# Patient Record
Sex: Male | Born: 1950
Health system: Southern US, Community
[De-identification: ages and names within clinical notes are randomized; demographics above are authoritative.]

## PROBLEM LIST (undated history)

## (undated) DIAGNOSIS — E785 Hyperlipidemia, unspecified: Secondary | ICD-10-CM

## (undated) DIAGNOSIS — G4733 Obstructive sleep apnea (adult) (pediatric): Secondary | ICD-10-CM

## (undated) DIAGNOSIS — S92909A Unspecified fracture of unspecified foot, initial encounter for closed fracture: Secondary | ICD-10-CM

## (undated) DIAGNOSIS — M549 Dorsalgia, unspecified: Secondary | ICD-10-CM

## (undated) DIAGNOSIS — J449 Chronic obstructive pulmonary disease, unspecified: Secondary | ICD-10-CM

## (undated) DIAGNOSIS — Z9989 Dependence on other enabling machines and devices: Secondary | ICD-10-CM

## (undated) DIAGNOSIS — E119 Type 2 diabetes mellitus without complications: Secondary | ICD-10-CM

## (undated) DIAGNOSIS — I10 Essential (primary) hypertension: Secondary | ICD-10-CM

## (undated) DIAGNOSIS — E669 Obesity, unspecified: Secondary | ICD-10-CM

## (undated) HISTORY — DX: Essential (primary) hypertension: I10

## (undated) HISTORY — DX: Obstructive sleep apnea (adult) (pediatric): G47.33

## (undated) HISTORY — DX: Unspecified fracture of unspecified foot, initial encounter for closed fracture: S92.909A

## (undated) HISTORY — DX: Type 2 diabetes mellitus without complications: E11.9

## (undated) HISTORY — DX: Hyperlipidemia, unspecified: E78.5

## (undated) HISTORY — PX: SHOULDER SURGERY: SHX246

## (undated) HISTORY — PX: OTHER SURGICAL HISTORY: SHX169

## (undated) HISTORY — DX: Dorsalgia, unspecified: M54.9

## (undated) HISTORY — DX: Dependence on other enabling machines and devices: Z99.89

---

## 1997-10-26 DIAGNOSIS — S92909A Unspecified fracture of unspecified foot, initial encounter for closed fracture: Secondary | ICD-10-CM

## 1997-10-26 HISTORY — DX: Unspecified fracture of unspecified foot, initial encounter for closed fracture: S92.909A

## 1998-06-23 ENCOUNTER — Encounter: Payer: Self-pay | Admitting: *Deleted

## 1998-06-23 ENCOUNTER — Observation Stay (HOSPITAL_COMMUNITY): Admission: EM | Admit: 1998-06-23 | Discharge: 1998-06-23 | Payer: Self-pay | Admitting: Emergency Medicine

## 1999-08-18 ENCOUNTER — Emergency Department (HOSPITAL_COMMUNITY): Admission: EM | Admit: 1999-08-18 | Discharge: 1999-08-18 | Payer: Self-pay | Admitting: Emergency Medicine

## 1999-08-18 ENCOUNTER — Encounter: Payer: Self-pay | Admitting: Emergency Medicine

## 2002-02-27 ENCOUNTER — Encounter: Admission: RE | Admit: 2002-02-27 | Discharge: 2002-02-27 | Payer: Self-pay | Admitting: Internal Medicine

## 2002-02-27 ENCOUNTER — Encounter: Payer: Self-pay | Admitting: Internal Medicine

## 2003-01-06 ENCOUNTER — Encounter: Payer: Self-pay | Admitting: Pulmonary Disease

## 2004-12-16 ENCOUNTER — Emergency Department (HOSPITAL_COMMUNITY): Admission: EM | Admit: 2004-12-16 | Discharge: 2004-12-17 | Payer: Self-pay | Admitting: Emergency Medicine

## 2005-05-14 ENCOUNTER — Ambulatory Visit: Payer: Self-pay

## 2005-05-15 ENCOUNTER — Ambulatory Visit: Payer: Self-pay | Admitting: Unknown Physician Specialty

## 2005-05-15 HISTORY — PX: COLONOSCOPY: SHX174

## 2005-11-16 ENCOUNTER — Ambulatory Visit: Payer: Self-pay | Admitting: Surgery

## 2005-12-31 ENCOUNTER — Encounter: Payer: Self-pay | Admitting: Family Medicine

## 2005-12-31 LAB — CONVERTED CEMR LAB: PSA: 0.81 ng/mL

## 2006-10-21 ENCOUNTER — Ambulatory Visit: Payer: Self-pay | Admitting: Family Medicine

## 2006-11-12 ENCOUNTER — Ambulatory Visit: Payer: Self-pay | Admitting: Internal Medicine

## 2007-02-07 ENCOUNTER — Ambulatory Visit: Payer: Self-pay | Admitting: Family Medicine

## 2007-02-21 ENCOUNTER — Telehealth (INDEPENDENT_AMBULATORY_CARE_PROVIDER_SITE_OTHER): Payer: Self-pay | Admitting: *Deleted

## 2007-05-04 ENCOUNTER — Encounter: Payer: Self-pay | Admitting: Family Medicine

## 2007-05-04 DIAGNOSIS — I10 Essential (primary) hypertension: Secondary | ICD-10-CM

## 2007-05-04 DIAGNOSIS — G4733 Obstructive sleep apnea (adult) (pediatric): Secondary | ICD-10-CM

## 2007-05-04 DIAGNOSIS — K649 Unspecified hemorrhoids: Secondary | ICD-10-CM | POA: Insufficient documentation

## 2007-05-04 DIAGNOSIS — J42 Unspecified chronic bronchitis: Secondary | ICD-10-CM

## 2007-09-27 ENCOUNTER — Ambulatory Visit: Payer: Self-pay | Admitting: Family Medicine

## 2007-09-29 ENCOUNTER — Telehealth: Payer: Self-pay | Admitting: Family Medicine

## 2007-10-02 DIAGNOSIS — M479 Spondylosis, unspecified: Secondary | ICD-10-CM | POA: Insufficient documentation

## 2007-10-09 ENCOUNTER — Emergency Department (HOSPITAL_COMMUNITY): Admission: EM | Admit: 2007-10-09 | Discharge: 2007-10-09 | Payer: Self-pay | Admitting: Emergency Medicine

## 2007-10-11 ENCOUNTER — Ambulatory Visit: Payer: Self-pay | Admitting: Urology

## 2007-10-11 ENCOUNTER — Encounter: Payer: Self-pay | Admitting: Family Medicine

## 2007-11-14 ENCOUNTER — Ambulatory Visit: Payer: Self-pay | Admitting: Urology

## 2007-11-17 ENCOUNTER — Ambulatory Visit: Payer: Self-pay | Admitting: Urology

## 2007-12-07 ENCOUNTER — Encounter: Payer: Self-pay | Admitting: Family Medicine

## 2007-12-07 ENCOUNTER — Ambulatory Visit: Payer: Self-pay | Admitting: Urology

## 2008-06-25 ENCOUNTER — Encounter: Payer: Self-pay | Admitting: Family Medicine

## 2008-06-28 ENCOUNTER — Ambulatory Visit: Payer: Self-pay | Admitting: Urology

## 2008-07-03 ENCOUNTER — Encounter: Payer: Self-pay | Admitting: Family Medicine

## 2008-10-06 ENCOUNTER — Encounter: Payer: Self-pay | Admitting: Family Medicine

## 2008-10-06 ENCOUNTER — Emergency Department (HOSPITAL_COMMUNITY): Admission: EM | Admit: 2008-10-06 | Discharge: 2008-10-06 | Payer: Self-pay | Admitting: Emergency Medicine

## 2008-10-09 ENCOUNTER — Encounter: Payer: Self-pay | Admitting: Pulmonary Disease

## 2008-10-10 ENCOUNTER — Encounter: Payer: Self-pay | Admitting: Family Medicine

## 2008-10-10 ENCOUNTER — Encounter: Payer: Self-pay | Admitting: Pulmonary Disease

## 2008-10-15 ENCOUNTER — Encounter: Payer: Self-pay | Admitting: Pulmonary Disease

## 2008-10-16 ENCOUNTER — Encounter: Payer: Self-pay | Admitting: Pulmonary Disease

## 2008-10-17 ENCOUNTER — Ambulatory Visit: Payer: Self-pay | Admitting: Family Medicine

## 2008-10-17 DIAGNOSIS — B370 Candidal stomatitis: Secondary | ICD-10-CM

## 2008-10-31 ENCOUNTER — Encounter: Payer: Self-pay | Admitting: Family Medicine

## 2008-11-05 ENCOUNTER — Telehealth: Payer: Self-pay | Admitting: Family Medicine

## 2008-11-06 ENCOUNTER — Ambulatory Visit: Payer: Self-pay | Admitting: Pulmonary Disease

## 2008-11-07 ENCOUNTER — Telehealth (INDEPENDENT_AMBULATORY_CARE_PROVIDER_SITE_OTHER): Payer: Self-pay | Admitting: *Deleted

## 2008-11-13 ENCOUNTER — Telehealth: Payer: Self-pay | Admitting: Pulmonary Disease

## 2008-11-14 ENCOUNTER — Telehealth: Payer: Self-pay | Admitting: Pulmonary Disease

## 2008-11-15 ENCOUNTER — Encounter: Payer: Self-pay | Admitting: Pulmonary Disease

## 2008-11-20 ENCOUNTER — Telehealth: Payer: Self-pay | Admitting: Pulmonary Disease

## 2008-11-22 ENCOUNTER — Ambulatory Visit: Payer: Self-pay | Admitting: Family Medicine

## 2008-11-22 ENCOUNTER — Encounter: Payer: Self-pay | Admitting: Pulmonary Disease

## 2008-11-22 DIAGNOSIS — M549 Dorsalgia, unspecified: Secondary | ICD-10-CM | POA: Insufficient documentation

## 2008-11-29 ENCOUNTER — Encounter: Payer: Self-pay | Admitting: Pulmonary Disease

## 2008-12-08 ENCOUNTER — Encounter: Payer: Self-pay | Admitting: Pulmonary Disease

## 2008-12-11 ENCOUNTER — Encounter: Payer: Self-pay | Admitting: Family Medicine

## 2008-12-11 ENCOUNTER — Encounter: Payer: Self-pay | Admitting: Pulmonary Disease

## 2008-12-18 ENCOUNTER — Encounter: Payer: Self-pay | Admitting: Pulmonary Disease

## 2008-12-18 ENCOUNTER — Telehealth (INDEPENDENT_AMBULATORY_CARE_PROVIDER_SITE_OTHER): Payer: Self-pay | Admitting: *Deleted

## 2009-06-24 ENCOUNTER — Encounter: Payer: Self-pay | Admitting: Family Medicine

## 2009-10-22 ENCOUNTER — Telehealth (INDEPENDENT_AMBULATORY_CARE_PROVIDER_SITE_OTHER): Payer: Self-pay | Admitting: *Deleted

## 2009-11-14 ENCOUNTER — Telehealth: Payer: Self-pay | Admitting: Family Medicine

## 2009-11-14 ENCOUNTER — Ambulatory Visit: Payer: Self-pay | Admitting: Family Medicine

## 2009-11-14 DIAGNOSIS — M1710 Unilateral primary osteoarthritis, unspecified knee: Secondary | ICD-10-CM | POA: Insufficient documentation

## 2009-11-14 DIAGNOSIS — IMO0002 Reserved for concepts with insufficient information to code with codable children: Secondary | ICD-10-CM | POA: Insufficient documentation

## 2010-05-13 ENCOUNTER — Telehealth: Payer: Self-pay | Admitting: Family Medicine

## 2010-05-19 ENCOUNTER — Ambulatory Visit: Payer: Self-pay | Admitting: Family Medicine

## 2010-05-19 DIAGNOSIS — M2390 Unspecified internal derangement of unspecified knee: Secondary | ICD-10-CM | POA: Insufficient documentation

## 2010-11-23 LAB — CONVERTED CEMR LAB
ALT: 42 units/L (ref 0–53)
AST: 29 units/L (ref 0–37)
Albumin: 4.2 g/dL (ref 3.5–5.2)
Alkaline Phosphatase: 57 units/L (ref 39–117)
BUN: 9 mg/dL (ref 6–23)
Bilirubin Urine: NEGATIVE
Bilirubin, Direct: 0.1 mg/dL (ref 0.0–0.3)
Blood in Urine, dipstick: NEGATIVE
CO2: 30 meq/L (ref 19–32)
Chloride: 100 meq/L (ref 96–112)
Creatinine, Ser: 0.8 mg/dL (ref 0.4–1.5)
GFR calc Af Amer: 128 mL/min
GFR calc non Af Amer: 106 mL/min
Glucose, Bld: 94 mg/dL (ref 70–99)
Glucose, Urine, Semiquant: NEGATIVE
Hemoglobin: 15.7 g/dL (ref 13.0–17.0)
Lymphocytes Relative: 14.1 % (ref 12.0–46.0)
Neutrophils Relative %: 74.8 % (ref 43.0–77.0)
Specific Gravity, Urine: <1.005
TSH: 1.67 microintl units/mL (ref 0.35–5.50)
Total Bilirubin: 0.7 mg/dL (ref 0.3–1.2)
Total Protein: 7.4 g/dL (ref 6.0–8.3)
Triglycerides: 230 mg/dL (ref 0–149)
VLDL: 46 mg/dL — ABNORMAL HIGH (ref 0–40)
WBC Urine, dipstick: NEGATIVE
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
## 2010-11-25 NOTE — Progress Notes (Signed)
Summary: wants injection  Phone Note Call from Patient Call back at [phone]   Caller: Patient Call For: Hannah Beat MD Summary of Call: Pt was given an injection in january for right knee pain.  This helped but the pain is coming back and he is asking if he can get another injection. Initial call taken by: Lowella Petties CMA,  May 13, 2010 12:22 PM  Follow-up for Phone Call        yes  xray appt 20 minute before appt, I want to see how much arthritis his knee has. Follow-up by: Hannah Beat MD,  May 13, 2010 1:04 PM
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
## 2010-11-25 NOTE — Progress Notes (Signed)
Summary: Ibuprofen 800mg  rx  Phone Note Call from Patient Call back at [phone] cell   Caller: Patient Call For: Phillip Beat MD Summary of Call: Pt saw Dr. Patsy Lager earlier today and pt thought Dr. Patsy Lager was going to give him a rx for Ibuprofen 800mg . After pt left he realized he did not have rx and request [facility] 800mg  [facility] [address] [phone]. Pt request a 90 day supply because is cheaper. Please advise.  Initial call taken by: Lewanda Rife LPN,  November 14, 2009 3:09 PM  Follow-up for Phone Call        Call in Ibuprofen 800 mg, 1 by mouth three times a day as needed pain, #270, 1 refill  [facility] as [facility] Follow-up by: Phillip Beat MD,  November 14, 2009 3:16 PM  Additional Follow-up for Phone Call Additional follow up Details #1::        rx called in Additional Follow-up by: Benny Lennert CMA (AAMA),  November 14, 2009 3:20 PM

## 2010-12-11 ENCOUNTER — Telehealth (INDEPENDENT_AMBULATORY_CARE_PROVIDER_SITE_OTHER): Payer: Self-pay | Admitting: *Deleted

## 2010-12-17 ENCOUNTER — Encounter (INDEPENDENT_AMBULATORY_CARE_PROVIDER_SITE_OTHER): Payer: Self-pay | Admitting: *Deleted

## 2010-12-17 ENCOUNTER — Other Ambulatory Visit: Payer: Self-pay | Admitting: Family Medicine

## 2010-12-17 ENCOUNTER — Other Ambulatory Visit (INDEPENDENT_AMBULATORY_CARE_PROVIDER_SITE_OTHER): Payer: BC Managed Care – PPO

## 2010-12-17 DIAGNOSIS — R5383 Other fatigue: Secondary | ICD-10-CM

## 2010-12-17 DIAGNOSIS — R5381 Other malaise: Secondary | ICD-10-CM

## 2010-12-17 DIAGNOSIS — Z131 Encounter for screening for diabetes mellitus: Secondary | ICD-10-CM

## 2010-12-17 DIAGNOSIS — I10 Essential (primary) hypertension: Secondary | ICD-10-CM

## 2010-12-17 DIAGNOSIS — Z125 Encounter for screening for malignant neoplasm of prostate: Secondary | ICD-10-CM

## 2010-12-17 DIAGNOSIS — Z13220 Encounter for screening for lipoid disorders: Secondary | ICD-10-CM

## 2010-12-17 DIAGNOSIS — E119 Type 2 diabetes mellitus without complications: Secondary | ICD-10-CM

## 2010-12-17 LAB — CBC WITH DIFFERENTIAL/PLATELET
Basophils Absolute: 0 10*3/uL (ref 0.0–0.1)
Eosinophils Absolute: 0.1 10*3/uL (ref 0.0–0.7)
Lymphocytes Relative: 22.1 % (ref 12.0–46.0)
Lymphs Abs: 1.6 10*3/uL (ref 0.7–4.0)
MCHC: 34.5 g/dL (ref 30.0–36.0)
Monocytes Absolute: 0.7 10*3/uL (ref 0.1–1.0)
Neutro Abs: 4.9 10*3/uL (ref 1.4–7.7)
Platelets: 282 10*3/uL (ref 150.0–400.0)
WBC: 7.4 10*3/uL (ref 4.5–10.5)

## 2010-12-17 LAB — BASIC METABOLIC PANEL
CO2: 30 mEq/L (ref 19–32)
Calcium: 9.8 mg/dL (ref 8.4–10.5)
GFR: 84.52 mL/min (ref >60.00)
Glucose, Bld: 127 mg/dL — ABNORMAL HIGH (ref 70–99)
Potassium: 4.2 mEq/L (ref 3.5–5.1)
Sodium: 141 mEq/L (ref 135–145)

## 2010-12-17 LAB — HEPATIC FUNCTION PANEL
ALT: 52 U/L (ref 0–53)
Total Protein: 7.1 g/dL (ref 6.0–8.3)

## 2010-12-17 LAB — LIPID PANEL: HDL: 39.5 mg/dL (ref >39.00)

## 2010-12-17 LAB — PSA: PSA: 0.78 ng/mL (ref 0.10–4.00)

## 2010-12-17 NOTE — Progress Notes (Signed)
----   Converted from flag ---- ---- 12/11/2010 12:15 PM, Hannah Beat MD wrote: Prephysical Labs, several days before, fasting BMP, HFP, FLP, CBC with diff, TSH, PSA: v77.91, v77.1, ,780.79, v76.44   ---- 12/11/2010 8:53 AM, Liane Comber CMA (AAMA) wrote: Lab orders please! Good Morning! This pt is scheduled for cpx labs Wed, which labs to draw and dx codes to use? Thanks Tasha ------------------------------

## 2010-12-24 ENCOUNTER — Encounter: Payer: Self-pay | Admitting: Family Medicine

## 2010-12-24 ENCOUNTER — Encounter (INDEPENDENT_AMBULATORY_CARE_PROVIDER_SITE_OTHER): Payer: BC Managed Care – PPO | Admitting: Family Medicine

## 2010-12-24 DIAGNOSIS — N521 Erectile dysfunction due to diseases classified elsewhere: Secondary | ICD-10-CM

## 2010-12-24 DIAGNOSIS — Z0000 Encounter for general adult medical examination without abnormal findings: Secondary | ICD-10-CM

## 2010-12-24 DIAGNOSIS — E785 Hyperlipidemia, unspecified: Secondary | ICD-10-CM

## 2010-12-24 DIAGNOSIS — E1159 Type 2 diabetes mellitus with other circulatory complications: Secondary | ICD-10-CM | POA: Insufficient documentation

## 2010-12-24 DIAGNOSIS — I10 Essential (primary) hypertension: Secondary | ICD-10-CM

## 2010-12-24 DIAGNOSIS — E119 Type 2 diabetes mellitus without complications: Secondary | ICD-10-CM
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
## 2011-03-09 ENCOUNTER — Telehealth: Payer: Self-pay | Admitting: *Deleted

## 2011-03-09 NOTE — Telephone Encounter (Signed)
Patient advised.

## 2011-03-09 NOTE — Telephone Encounter (Signed)
Patient wants to know if you will give him a written order to have his lab work done where he works? Patient states that he has an appointment scheduled for lab work next week and to see you the following week. Patient would like to cancel his appointment here for the labs and have it at work if that is okay with you. Call patient when lab order is ready for pickup.

## 2011-03-09 NOTE — Telephone Encounter (Signed)
Ordered - given to [employer].   Make sure he either: 1. Gets a copy of his labs and brings them in himself from his work. 2. We should push out date of appointment to later to ensure that they are here and seen at time of appointment.

## 2011-03-12 ENCOUNTER — Other Ambulatory Visit: Payer: Self-pay | Admitting: Family Medicine

## 2011-03-12 DIAGNOSIS — E785 Hyperlipidemia, unspecified: Secondary | ICD-10-CM

## 2011-03-13 NOTE — Assessment & Plan Note (Signed)
[facility]                                 ON-CALL NOTE   NAME:[name], [name]                        MRN:          [id number]  DATE:01/29/2007                            DOB:          1951/10/12    Caller:  Elyn Aquas.  Phone number:  [phone].   PRIMARY CARE PHYSICIAN:  Karie Schwalbe, M.D.   SUBJECTIVE:  The patient has hemorrhoids and was given Proctosol last  week.  He was instructed to use it four times a day for 14 days but has  run out of the quantity that was given.  He is requesting a refill  because his hemorrhoids are improved but not 100% better.   ASSESSMENT/PLAN:  Sent in refill of Proctosol HC 2.5% cream, apply to  affected area four times per day x14 days, zero refills.  Recommended if  symptoms do not improve to follow up with primary care doctor.     Kerby Nora, MD  Electronically Signed    AB/MedQ  DD: 01/29/2007  DT: 01/30/2007  Job #: [id number]

## 2011-03-16 ENCOUNTER — Other Ambulatory Visit: Payer: BC Managed Care – PPO

## 2011-03-21 ENCOUNTER — Encounter: Payer: Self-pay | Admitting: Family Medicine

## 2011-03-25 ENCOUNTER — Ambulatory Visit (INDEPENDENT_AMBULATORY_CARE_PROVIDER_SITE_OTHER): Payer: BC Managed Care – PPO | Admitting: Family Medicine

## 2011-03-25 ENCOUNTER — Encounter: Payer: Self-pay | Admitting: Family Medicine

## 2011-03-25 DIAGNOSIS — IMO0002 Reserved for concepts with insufficient information to code with codable children: Secondary | ICD-10-CM

## 2011-03-25 DIAGNOSIS — E119 Type 2 diabetes mellitus without complications: Secondary | ICD-10-CM

## 2011-03-25 DIAGNOSIS — M1710 Unilateral primary osteoarthritis, unspecified knee: Secondary | ICD-10-CM

## 2011-03-25 DIAGNOSIS — E785 Hyperlipidemia, unspecified: Secondary | ICD-10-CM

## 2011-03-25 DIAGNOSIS — I10 Essential (primary) hypertension: Secondary | ICD-10-CM

## 2011-03-25 MED ORDER — SILDENAFIL CITRATE 100 MG PO TABS: 100.0000 mg | ORAL_TABLET | ORAL | Status: DC | PRN
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
## 2011-07-31 LAB — DIFFERENTIAL
Eosinophils Absolute: 0.1 10*3/uL (ref 0.0–0.7)
Eosinophils Relative: 2 % (ref 0–5)
Monocytes Relative: 6 % (ref 3–12)
Neutro Abs: 5.3 10*3/uL (ref 1.7–7.7)

## 2011-07-31 LAB — CBC
HCT: 44.1 % (ref 39.0–52.0)
Hemoglobin: 15 g/dL (ref 13.0–17.0)
MCHC: 34.1 g/dL (ref 30.0–36.0)
Platelets: 241 10*3/uL (ref 150–400)
RBC: 4.77 MIL/uL (ref 4.22–5.81)

## 2011-07-31 LAB — POCT CARDIAC MARKERS
CKMB, poc: 1.4 ng/mL (ref 1.0–8.0)
Troponin i, poc: <0.05 ng/mL (ref 0.00–0.09)

## 2011-07-31 LAB — COMPREHENSIVE METABOLIC PANEL
ALT: 45 U/L (ref 0–53)
Albumin: 3.7 g/dL (ref 3.5–5.2)
CO2: 26 mEq/L (ref 19–32)
Calcium: 8.9 mg/dL (ref 8.4–10.5)
Chloride: 99 mEq/L (ref 96–112)
GFR calc Af Amer: >60 mL/min (ref >60)
GFR calc non Af Amer: >60 mL/min (ref >60)
Glucose, Bld: 180 mg/dL — ABNORMAL HIGH (ref 70–99)
Total Bilirubin: 1.5 mg/dL — ABNORMAL HIGH (ref 0.3–1.2)

## 2011-08-03 LAB — CBC
HCT: 43.7
Hemoglobin: 15.2
MCHC: 34.9
MCV: 90.1
Platelets: 304
RBC: 4.85
RDW: 13.4
WBC: 8.1

## 2011-08-03 LAB — COMPREHENSIVE METABOLIC PANEL
ALT: 41
AST: 31
Albumin: 3.9
Alkaline Phosphatase: 54
CO2: 25
Chloride: 105
Potassium: 3.9
Sodium: 138
Total Bilirubin: 1.1

## 2011-08-03 LAB — DIFFERENTIAL
Basophils Absolute: 0
Basophils Relative: 0
Eosinophils Absolute: 0.1 — ABNORMAL LOW
Eosinophils Relative: 1
Lymphocytes Relative: 18
Lymphs Abs: 1.5
Monocytes Absolute: 0.3
Monocytes Relative: 4
Neutro Abs: 6.2
Neutrophils Relative %: 76

## 2011-08-03 LAB — URINALYSIS, ROUTINE W REFLEX MICROSCOPIC
Bilirubin Urine: NEGATIVE
Glucose, UA: NEGATIVE
Ketones, ur: NEGATIVE
Leukocytes, UA: NEGATIVE
Nitrite: NEGATIVE
Protein, ur: 30 — AB
Specific Gravity, Urine: 1.027
Urobilinogen, UA: 0.2
pH: 6

## 2011-08-03 LAB — COMPREHENSIVE METABOLIC PANEL WITH GFR
BUN: 12
Calcium: 9.1
Creatinine, Ser: 0.85
GFR calc Af Amer: >60
GFR calc non Af Amer: >60
Glucose, Bld: 153 — ABNORMAL HIGH
Total Protein: 7.2

## 2011-08-03 LAB — URINE MICROSCOPIC-ADD ON

## 2011-08-03 LAB — LIPASE, BLOOD: Lipase: 22

## 2011-12-29 ENCOUNTER — Other Ambulatory Visit: Payer: Self-pay | Admitting: Family Medicine

## 2012-01-06 ENCOUNTER — Other Ambulatory Visit: Payer: Self-pay | Admitting: Family Medicine

## 2012-04-13 ENCOUNTER — Other Ambulatory Visit (INDEPENDENT_AMBULATORY_CARE_PROVIDER_SITE_OTHER): Payer: BC Managed Care – PPO

## 2012-04-13 DIAGNOSIS — Z125 Encounter for screening for malignant neoplasm of prostate: Secondary | ICD-10-CM

## 2012-04-13 DIAGNOSIS — Z79899 Other long term (current) drug therapy: Secondary | ICD-10-CM

## 2012-04-13 DIAGNOSIS — E785 Hyperlipidemia, unspecified: Secondary | ICD-10-CM

## 2012-04-13 DIAGNOSIS — E119 Type 2 diabetes mellitus without complications: Secondary | ICD-10-CM

## 2012-04-13 LAB — CBC WITH DIFFERENTIAL/PLATELET
Eosinophils Relative: 1.9 % (ref 0.0–5.0)
HCT: 44.5 % (ref 39.0–52.0)
Monocytes Relative: 8.6 % (ref 3.0–12.0)
Neutrophils Relative %: 68.3 % (ref 43.0–77.0)
Platelets: 238 10*3/uL (ref 150.0–400.0)
WBC: 6.8 10*3/uL (ref 4.5–10.5)

## 2012-04-13 LAB — BASIC METABOLIC PANEL
CO2: 27 mEq/L (ref 19–32)
Glucose, Bld: 100 mg/dL — ABNORMAL HIGH (ref 70–99)
Potassium: 4.6 mEq/L (ref 3.5–5.1)
Sodium: 140 mEq/L (ref 135–145)

## 2012-04-13 LAB — HEPATIC FUNCTION PANEL
ALT: 38 U/L (ref 0–53)
AST: 26 U/L (ref 0–37)
Albumin: 3.9 g/dL (ref 3.5–5.2)

## 2012-04-13 LAB — LIPID PANEL
HDL: 45.7 mg/dL (ref >39.00)
Total CHOL/HDL Ratio: 3

## 2012-04-13 LAB — MICROALBUMIN / CREATININE URINE RATIO
Microalb Creat Ratio: 0.5 mg/g (ref 0.0–30.0)
Microalb, Ur: 0.9 mg/dL (ref 0.0–1.9)

## 2012-04-13 LAB — HEMOGLOBIN A1C: Hgb A1c MFr Bld: 6.1 % (ref 4.6–6.5)

## 2012-04-20 ENCOUNTER — Encounter: Payer: Self-pay | Admitting: Family Medicine

## 2012-04-20 ENCOUNTER — Ambulatory Visit (INDEPENDENT_AMBULATORY_CARE_PROVIDER_SITE_OTHER): Payer: BC Managed Care – PPO | Admitting: Family Medicine

## 2012-04-20 VITALS — BP 152/78 | HR 72 | Temp 98.4°F | Ht 68.0 in | Wt 264.0 lb

## 2012-04-20 DIAGNOSIS — Z2911 Encounter for prophylactic immunotherapy for respiratory syncytial virus (RSV): Secondary | ICD-10-CM

## 2012-04-20 DIAGNOSIS — Z23 Encounter for immunization: Secondary | ICD-10-CM

## 2012-04-20 DIAGNOSIS — Z0000 Encounter for general adult medical examination without abnormal findings: Secondary | ICD-10-CM

## 2012-04-20 MED ORDER — ATORVASTATIN CALCIUM 20 MG PO TABS: 20.0000 mg | ORAL_TABLET | Freq: Every day | ORAL | Status: DC
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
## 2012-05-10 ENCOUNTER — Telehealth: Payer: Self-pay | Admitting: *Deleted

## 2012-05-10 NOTE — Telephone Encounter (Signed)
Patient's cpap water chamber started leaking last night and he would like verbal ok for new water chamber and mask called to [employer] [facility].  They will fax CME for signature.  OK to call in?

## 2012-05-10 NOTE — Telephone Encounter (Signed)
Called [facility] to give verbal ok for supplies.  He said pt has already paid out of pocket for supplies, but he would like CME signed for next time.

## 2012-05-10 NOTE — Telephone Encounter (Signed)
Okay to call in. 

## 2012-09-03 ENCOUNTER — Other Ambulatory Visit: Payer: Self-pay | Admitting: Family Medicine

## 2012-10-13 ENCOUNTER — Other Ambulatory Visit: Payer: Self-pay | Admitting: Family Medicine

## 2012-10-31 ENCOUNTER — Ambulatory Visit (INDEPENDENT_AMBULATORY_CARE_PROVIDER_SITE_OTHER): Payer: BC Managed Care – PPO | Admitting: Family Medicine

## 2012-10-31 ENCOUNTER — Ambulatory Visit (INDEPENDENT_AMBULATORY_CARE_PROVIDER_SITE_OTHER)
Admission: RE | Admit: 2012-10-31 | Discharge: 2012-10-31 | Disposition: A | Discharge status: Home / Self Care | Payer: BC Managed Care – PPO | Source: Ambulatory Visit | Attending: Family Medicine | Admitting: Family Medicine

## 2012-10-31 ENCOUNTER — Encounter: Payer: Self-pay | Admitting: Family Medicine

## 2012-10-31 VITALS — BP 130/80 | HR 73 | Temp 98.5°F | Ht 68.0 in | Wt 278.8 lb

## 2012-10-31 DIAGNOSIS — R0602 Shortness of breath: Secondary | ICD-10-CM

## 2012-10-31 DIAGNOSIS — E119 Type 2 diabetes mellitus without complications: Secondary | ICD-10-CM

## 2012-10-31 DIAGNOSIS — R079 Chest pain, unspecified: Secondary | ICD-10-CM

## 2012-10-31 MED ORDER — LISINOPRIL-HYDROCHLOROTHIAZIDE 20-12.5 MG PO TABS: 2.0000 | ORAL_TABLET | Freq: Every day | ORAL | Status: DC

## 2012-10-31 MED ORDER — METFORMIN HCL ER 500 MG PO TB24: 500.0000 mg | ORAL_TABLET | Freq: Every day | ORAL | Status: DC

## 2012-10-31 MED ORDER — ATORVASTATIN CALCIUM 20 MG PO TABS: 20.0000 mg | ORAL_TABLET | Freq: Every day | ORAL | Status: DC

## 2012-10-31 NOTE — Patient Instructions (Signed)
REFERRAL: GO THE THE FRONT ROOM AT THE ENTRANCE OF OUR CLINIC, NEAR CHECK IN. ASK FOR MARION. SHE WILL HELP YOU SET UP YOUR REFERRAL. DATE: TIME:  
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
## 2012-11-01 LAB — BASIC METABOLIC PANEL
CO2: 26 mEq/L (ref 19–32)
Chloride: 101 mEq/L (ref 96–112)
Sodium: 136 mEq/L (ref 135–145)

## 2012-11-01 LAB — HEMOGLOBIN A1C: Hgb A1c MFr Bld: 6.6 % — ABNORMAL HIGH (ref 4.6–6.5)

## 2012-11-02 ENCOUNTER — Encounter: Payer: Self-pay | Admitting: *Deleted

## 2013-04-24 ENCOUNTER — Other Ambulatory Visit (INDEPENDENT_AMBULATORY_CARE_PROVIDER_SITE_OTHER): Payer: BC Managed Care – PPO

## 2013-04-24 DIAGNOSIS — I10 Essential (primary) hypertension: Secondary | ICD-10-CM

## 2013-04-24 DIAGNOSIS — Z125 Encounter for screening for malignant neoplasm of prostate: Secondary | ICD-10-CM

## 2013-04-24 DIAGNOSIS — R5381 Other malaise: Secondary | ICD-10-CM

## 2013-04-24 DIAGNOSIS — E785 Hyperlipidemia, unspecified: Secondary | ICD-10-CM

## 2013-04-24 DIAGNOSIS — Z79899 Other long term (current) drug therapy: Secondary | ICD-10-CM

## 2013-04-24 DIAGNOSIS — E119 Type 2 diabetes mellitus without complications: Secondary | ICD-10-CM

## 2013-04-24 LAB — BASIC METABOLIC PANEL
CO2: 25 mEq/L (ref 19–32)
Chloride: 108 mEq/L (ref 96–112)
Glucose, Bld: 148 mg/dL — ABNORMAL HIGH (ref 70–99)
Potassium: 4.7 mEq/L (ref 3.5–5.1)
Sodium: 141 mEq/L (ref 135–145)

## 2013-04-24 LAB — CBC WITH DIFFERENTIAL/PLATELET
Basophils Absolute: 0 10*3/uL (ref 0.0–0.1)
HCT: 42.8 % (ref 39.0–52.0)
Lymphs Abs: 1.6 10*3/uL (ref 0.7–4.0)
MCV: 94 fl (ref 78.0–100.0)
Monocytes Absolute: 0.5 10*3/uL (ref 0.1–1.0)
Monocytes Relative: 8.9 % (ref 3.0–12.0)
Platelets: 243 10*3/uL (ref 150.0–400.0)
RDW: 14.1 % (ref 11.5–14.6)

## 2013-04-24 LAB — MICROALBUMIN / CREATININE URINE RATIO: Microalb Creat Ratio: 0.5 mg/g (ref 0.0–30.0)

## 2013-04-24 LAB — LIPID PANEL
HDL: 41.4 mg/dL (ref >39.00)
Total CHOL/HDL Ratio: 3

## 2013-04-24 LAB — HEMOGLOBIN A1C: Hgb A1c MFr Bld: 6.8 % — ABNORMAL HIGH (ref 4.6–6.5)

## 2013-04-24 LAB — HEPATIC FUNCTION PANEL: Total Bilirubin: 0.5 mg/dL (ref 0.3–1.2)

## 2013-05-01 ENCOUNTER — Ambulatory Visit (INDEPENDENT_AMBULATORY_CARE_PROVIDER_SITE_OTHER): Payer: BC Managed Care – PPO | Admitting: Family Medicine

## 2013-05-01 ENCOUNTER — Encounter: Payer: Self-pay | Admitting: Family Medicine

## 2013-05-01 VITALS — BP 140/74 | HR 94 | Temp 98.6°F | Ht 68.0 in | Wt 276.5 lb

## 2013-05-01 DIAGNOSIS — Z0000 Encounter for general adult medical examination without abnormal findings: Secondary | ICD-10-CM

## 2013-05-01 MED ORDER — VARDENAFIL HCL 10 MG PO TABS: 10.0000 mg | ORAL_TABLET | Freq: Every day | ORAL | Status: DC | PRN
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
## 2013-05-04 ENCOUNTER — Other Ambulatory Visit: Payer: Self-pay

## 2013-10-24 ENCOUNTER — Telehealth: Payer: Self-pay

## 2013-10-24 DIAGNOSIS — G4730 Sleep apnea, unspecified: Secondary | ICD-10-CM

## 2013-10-24 NOTE — Telephone Encounter (Signed)
Lupita Leash, do you mind helping me do this? Easiest way is probably to write on paper script - Dr. B will likely sign without problem.  CPAP mask Dx: Obstructive Sleep Apnea (ICD-9 code should be in the chart) LON: 99 Years Disp: #1

## 2013-10-24 NOTE — Telephone Encounter (Signed)
Pt left v/m requesting order for new cpap mask faxed to [facility]; fax # [phone].

## 2013-10-24 NOTE — Telephone Encounter (Signed)
DME order for CPAP mask entered in [facility] and faxed to [facility] [phone].

## 2013-10-27 NOTE — Telephone Encounter (Signed)
Noted  

## 2013-10-27 NOTE — Telephone Encounter (Signed)
[name] with [facility] received referral for cpap face mask; [name] is faxing over certificate of medical necessity and only needs Dr Rometta Emery signature on form and also request pts last office visit note faxed back with certificate of medical necessity to fax # [phone].

## 2013-10-27 NOTE — Telephone Encounter (Signed)
Will complete when in inbox, route to CMA to send recs

## 2013-10-31 NOTE — Telephone Encounter (Signed)
Certificate of Medical Necessity and last office note faxed to [facility] [phone].

## 2013-11-21 ENCOUNTER — Encounter: Payer: Self-pay | Admitting: Internal Medicine

## 2013-11-21 ENCOUNTER — Ambulatory Visit (INDEPENDENT_AMBULATORY_CARE_PROVIDER_SITE_OTHER): Payer: BC Managed Care – PPO | Admitting: Internal Medicine

## 2013-11-21 VITALS — BP 148/82 | HR 86 | Temp 98.6°F | Wt 281.1 lb

## 2013-11-21 DIAGNOSIS — M25571 Pain in right ankle and joints of right foot: Secondary | ICD-10-CM

## 2013-11-21 DIAGNOSIS — J069 Acute upper respiratory infection, unspecified: Secondary | ICD-10-CM

## 2013-11-21 DIAGNOSIS — M25579 Pain in unspecified ankle and joints of unspecified foot: Secondary | ICD-10-CM

## 2013-11-21 DIAGNOSIS — M25572 Pain in left ankle and joints of left foot: Secondary | ICD-10-CM

## 2013-11-21 MED ORDER — AZITHROMYCIN 250 MG PO TABS: ORAL_TABLET | ORAL | Status: DC

## 2013-11-21 NOTE — Patient Instructions (Signed)
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
## 2013-11-21 NOTE — Progress Notes (Signed)
Pre-visit discussion using our clinic review tool. No additional management support is needed unless otherwise documented below in the visit note.  

## 2013-11-24 ENCOUNTER — Telehealth: Payer: Self-pay | Admitting: Family Medicine

## 2013-11-24 NOTE — Telephone Encounter (Signed)
Reviewed OV from 1/27.   Okay to write a note for him to remain out until Monday 2/1.  He was thought to have a viral illness, but was given a z-pack given he insisted. He may have viral infection.... Need to given symptoms more time to resolved. Usually 7-0 days of illness.

## 2013-11-24 NOTE — Telephone Encounter (Signed)
Pt is requesting note to miss work this week, he says job is threatening to fire him.  OV with Dr. Lorelei Pont on 11/21/13.  Sent to Dr. Diona Browner who is covering for Dr. Lorelei Pont while he is out of town.  He is also requesting refill on abx.

## 2013-11-24 NOTE — Telephone Encounter (Signed)
[name] notified as instructed by telephone.  Note for work written and placed up front for patient to pick up.

## 2013-11-29 ENCOUNTER — Telehealth: Payer: Self-pay | Admitting: Family Medicine

## 2013-11-29 NOTE — Telephone Encounter (Signed)
Pt dropped off FMLA forms to be completed. I have completed these forms w/the exception of one section, which needs Dr. Rometta Emery review (I have flagged this section).  I have placed the forms in a yellow folder and put on your desk to pass on to Dr. Diona Browner.  Once she has review/completed and signed, please return to me. Thank you.

## 2013-11-29 NOTE — Telephone Encounter (Signed)
Noted  

## 2013-11-30 DIAGNOSIS — Z0279 Encounter for issue of other medical certificate: Secondary | ICD-10-CM

## 2013-11-30 NOTE — Telephone Encounter (Signed)
Left mssg letting pt know he can p/u forms at front.

## 2013-12-10 ENCOUNTER — Other Ambulatory Visit: Payer: Self-pay | Admitting: Family Medicine

## 2014-02-14 ENCOUNTER — Other Ambulatory Visit: Payer: Self-pay | Admitting: Family Medicine

## 2014-03-13 ENCOUNTER — Telehealth: Payer: Self-pay | Admitting: Family Medicine

## 2014-03-13 NOTE — Telephone Encounter (Signed)
Patient's last physical was [id number].  Patient is considering retiring and he'll lose his insurance when he retires.  Patient wants to know if he can have a physical before 04/04/14.  He checked with his insurance and they said he can have one physical every calendar year.  Your next available for a physical is on 04/16/14.  Can patient be fit in before 04/04/14 for a physical?

## 2014-03-14 NOTE — Telephone Encounter (Signed)
30 min  thanks

## 2014-03-14 NOTE — Telephone Encounter (Signed)
To carrie:

## 2014-03-27 ENCOUNTER — Other Ambulatory Visit: Payer: Self-pay | Admitting: Family Medicine

## 2014-03-27 DIAGNOSIS — E785 Hyperlipidemia, unspecified: Secondary | ICD-10-CM

## 2014-03-27 DIAGNOSIS — Z125 Encounter for screening for malignant neoplasm of prostate: Secondary | ICD-10-CM

## 2014-03-27 DIAGNOSIS — Z79899 Other long term (current) drug therapy: Secondary | ICD-10-CM

## 2014-03-27 DIAGNOSIS — E119 Type 2 diabetes mellitus without complications: Secondary | ICD-10-CM

## 2014-03-28 ENCOUNTER — Other Ambulatory Visit (INDEPENDENT_AMBULATORY_CARE_PROVIDER_SITE_OTHER): Payer: BC Managed Care – PPO

## 2014-03-28 DIAGNOSIS — Z79899 Other long term (current) drug therapy: Secondary | ICD-10-CM

## 2014-03-28 DIAGNOSIS — E119 Type 2 diabetes mellitus without complications: Secondary | ICD-10-CM

## 2014-03-28 DIAGNOSIS — Z125 Encounter for screening for malignant neoplasm of prostate: Secondary | ICD-10-CM

## 2014-03-28 DIAGNOSIS — E785 Hyperlipidemia, unspecified: Secondary | ICD-10-CM

## 2014-03-28 DIAGNOSIS — R5383 Other fatigue: Secondary | ICD-10-CM

## 2014-03-28 DIAGNOSIS — I10 Essential (primary) hypertension: Secondary | ICD-10-CM

## 2014-03-28 DIAGNOSIS — R5381 Other malaise: Secondary | ICD-10-CM

## 2014-03-28 LAB — CBC WITH DIFFERENTIAL/PLATELET
BASOS PCT: 0.5 % (ref 0.0–3.0)
Basophils Absolute: 0 10*3/uL (ref 0.0–0.1)
EOS PCT: 2 % (ref 0.0–5.0)
Eosinophils Absolute: 0.1 10*3/uL (ref 0.0–0.7)
HCT: 46 % (ref 39.0–52.0)
Hemoglobin: 15.3 g/dL (ref 13.0–17.0)
LYMPHS PCT: 23.9 % (ref 12.0–46.0)
Lymphs Abs: 1.8 10*3/uL (ref 0.7–4.0)
MCHC: 33.2 g/dL (ref 30.0–36.0)
MCV: 93.5 fl (ref 78.0–100.0)
MONOS PCT: 9.8 % (ref 3.0–12.0)
Monocytes Absolute: 0.7 10*3/uL (ref 0.1–1.0)
NEUTROS PCT: 63.8 % (ref 43.0–77.0)
Neutro Abs: 4.8 10*3/uL (ref 1.4–7.7)
Platelets: 265 10*3/uL (ref 150.0–400.0)
RBC: 4.92 Mil/uL (ref 4.22–5.81)
RDW: 14.6 % (ref 11.5–15.5)
WBC: 7.5 10*3/uL (ref 4.0–10.5)

## 2014-03-28 LAB — HEPATIC FUNCTION PANEL
ALT: 54 U/L — AB (ref 0–53)
AST: 36 U/L (ref 0–37)
Albumin: 3.9 g/dL (ref 3.5–5.2)
Alkaline Phosphatase: 48 U/L (ref 39–117)
BILIRUBIN DIRECT: 0.2 mg/dL (ref 0.0–0.3)
TOTAL PROTEIN: 7.1 g/dL (ref 6.0–8.3)
Total Bilirubin: 1 mg/dL (ref 0.2–1.2)

## 2014-03-28 LAB — BASIC METABOLIC PANEL
BUN: 18 mg/dL (ref 6–23)
CO2: 28 mEq/L (ref 19–32)
CREATININE: 1 mg/dL (ref 0.4–1.5)
Calcium: 9.3 mg/dL (ref 8.4–10.5)
Chloride: 102 mEq/L (ref 96–112)
GFR: 82.63 mL/min (ref >60.00)
Glucose, Bld: 130 mg/dL — ABNORMAL HIGH (ref 70–99)
Potassium: 4.2 mEq/L (ref 3.5–5.1)
Sodium: 137 mEq/L (ref 135–145)

## 2014-03-28 LAB — MICROALBUMIN / CREATININE URINE RATIO
CREATININE, U: 293.9 mg/dL
MICROALB UR: 3.4 mg/dL — AB (ref 0.0–1.9)
Microalb Creat Ratio: 1.2 mg/g (ref 0.0–30.0)

## 2014-03-28 LAB — LIPID PANEL
CHOLESTEROL: 174 mg/dL (ref 0–200)
HDL: 41 mg/dL (ref >39.00)
LDL Cholesterol: 101 mg/dL — ABNORMAL HIGH (ref 0–99)
NonHDL: 133
TRIGLYCERIDES: 158 mg/dL — AB (ref 0.0–149.0)
Total CHOL/HDL Ratio: 4
VLDL: 31.6 mg/dL (ref 0.0–40.0)

## 2014-03-28 LAB — PSA: PSA: 1.19 ng/mL (ref 0.10–4.00)

## 2014-03-28 LAB — HEMOGLOBIN A1C: Hgb A1c MFr Bld: 6.8 % — ABNORMAL HIGH (ref 4.6–6.5)

## 2014-04-02 ENCOUNTER — Encounter: Payer: Self-pay | Admitting: Family Medicine

## 2014-04-02 ENCOUNTER — Ambulatory Visit (INDEPENDENT_AMBULATORY_CARE_PROVIDER_SITE_OTHER): Payer: BC Managed Care – PPO | Admitting: Family Medicine

## 2014-04-02 VITALS — BP 132/82 | HR 60 | Temp 98.4°F | Ht 68.0 in | Wt 285.5 lb

## 2014-04-02 DIAGNOSIS — I798 Other disorders of arteries, arterioles and capillaries in diseases classified elsewhere: Secondary | ICD-10-CM

## 2014-04-02 DIAGNOSIS — N521 Erectile dysfunction due to diseases classified elsewhere: Secondary | ICD-10-CM

## 2014-04-02 DIAGNOSIS — E1169 Type 2 diabetes mellitus with other specified complication: Secondary | ICD-10-CM | POA: Insufficient documentation

## 2014-04-02 DIAGNOSIS — N529 Male erectile dysfunction, unspecified: Secondary | ICD-10-CM

## 2014-04-02 DIAGNOSIS — E1159 Type 2 diabetes mellitus with other circulatory complications: Secondary | ICD-10-CM

## 2014-04-02 DIAGNOSIS — E785 Hyperlipidemia, unspecified: Secondary | ICD-10-CM

## 2014-04-02 DIAGNOSIS — Z23 Encounter for immunization: Secondary | ICD-10-CM

## 2014-04-02 DIAGNOSIS — I10 Essential (primary) hypertension: Secondary | ICD-10-CM

## 2014-04-02 DIAGNOSIS — Z0000 Encounter for general adult medical examination without abnormal findings: Secondary | ICD-10-CM

## 2014-04-02 MED ORDER — ATORVASTATIN CALCIUM 20 MG PO TABS: 20.0000 mg | ORAL_TABLET | Freq: Every day | ORAL | Status: DC

## 2014-04-02 MED ORDER — LISINOPRIL-HYDROCHLOROTHIAZIDE 20-12.5 MG PO TABS: ORAL_TABLET | ORAL | Status: DC

## 2014-04-02 MED ORDER — METFORMIN HCL ER 500 MG PO TB24: ORAL_TABLET | ORAL | Status: DC

## 2014-04-02 NOTE — Progress Notes (Signed)
Pre visit review using our clinic review tool, if applicable. No additional management support is needed unless otherwise documented below in the visit note. 
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
## 2014-04-02 NOTE — Patient Instructions (Signed)
Call Dr. Percell [place] office - when is my colonoscopy due?

## 2014-04-03 ENCOUNTER — Telehealth: Payer: Self-pay | Admitting: Family Medicine

## 2014-04-03 NOTE — Telephone Encounter (Signed)
Relevant patient education assigned to patient using Emmi. ° °

## 2014-04-25 ENCOUNTER — Other Ambulatory Visit: Payer: BC Managed Care – PPO

## 2014-05-02 ENCOUNTER — Encounter: Payer: BC Managed Care – PPO | Admitting: Family Medicine

## 2014-05-07 ENCOUNTER — Telehealth: Payer: Self-pay | Admitting: Family Medicine

## 2014-05-07 NOTE — Telephone Encounter (Signed)
Diabetic Bundle.  Pt needs lab appt to recheck LDL.  Left vm for pt to return call.

## 2014-07-26 IMAGING — CR DG CHEST 2V
2 series · 2 of 2 positions shown · non-contrast
Comparison: 10/06/2008.

CLINICAL DATA: Chest pain with shortness of breath.

CHEST - 2 VIEW

[view not recorded (1 of 2)]
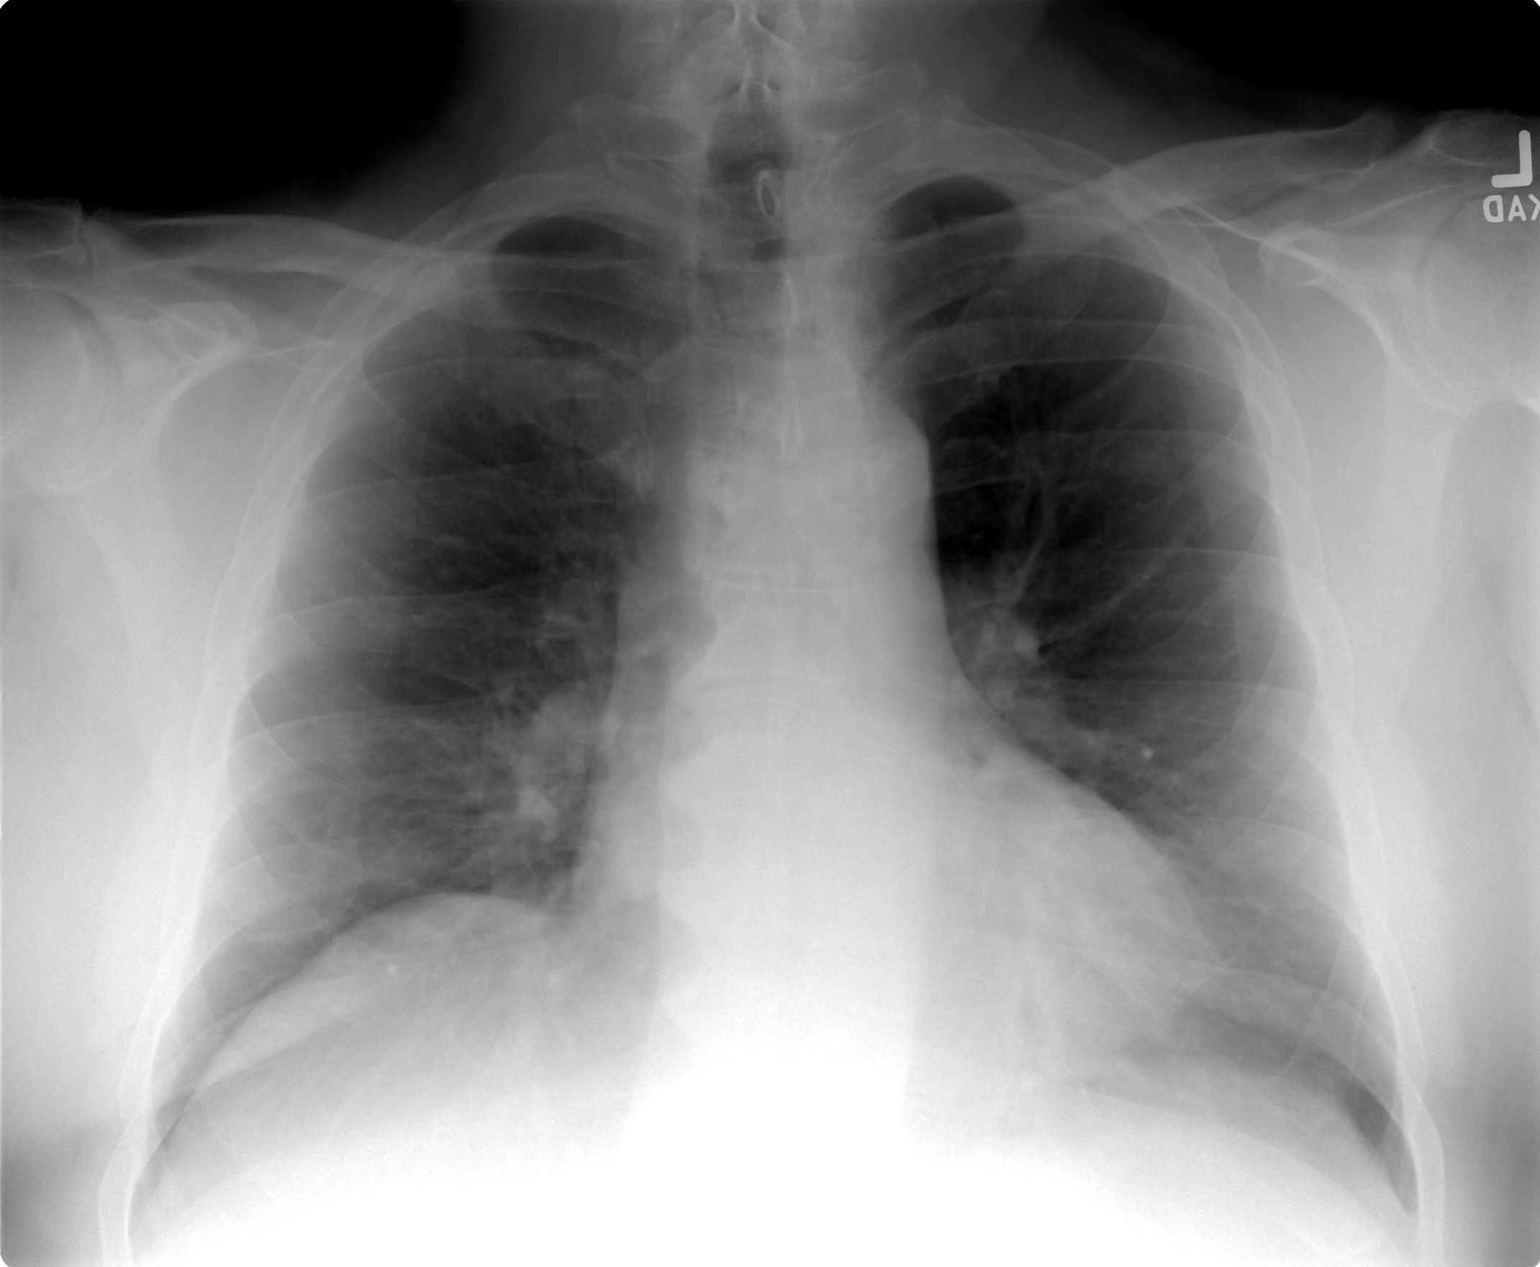

[view not recorded (2 of 2)]
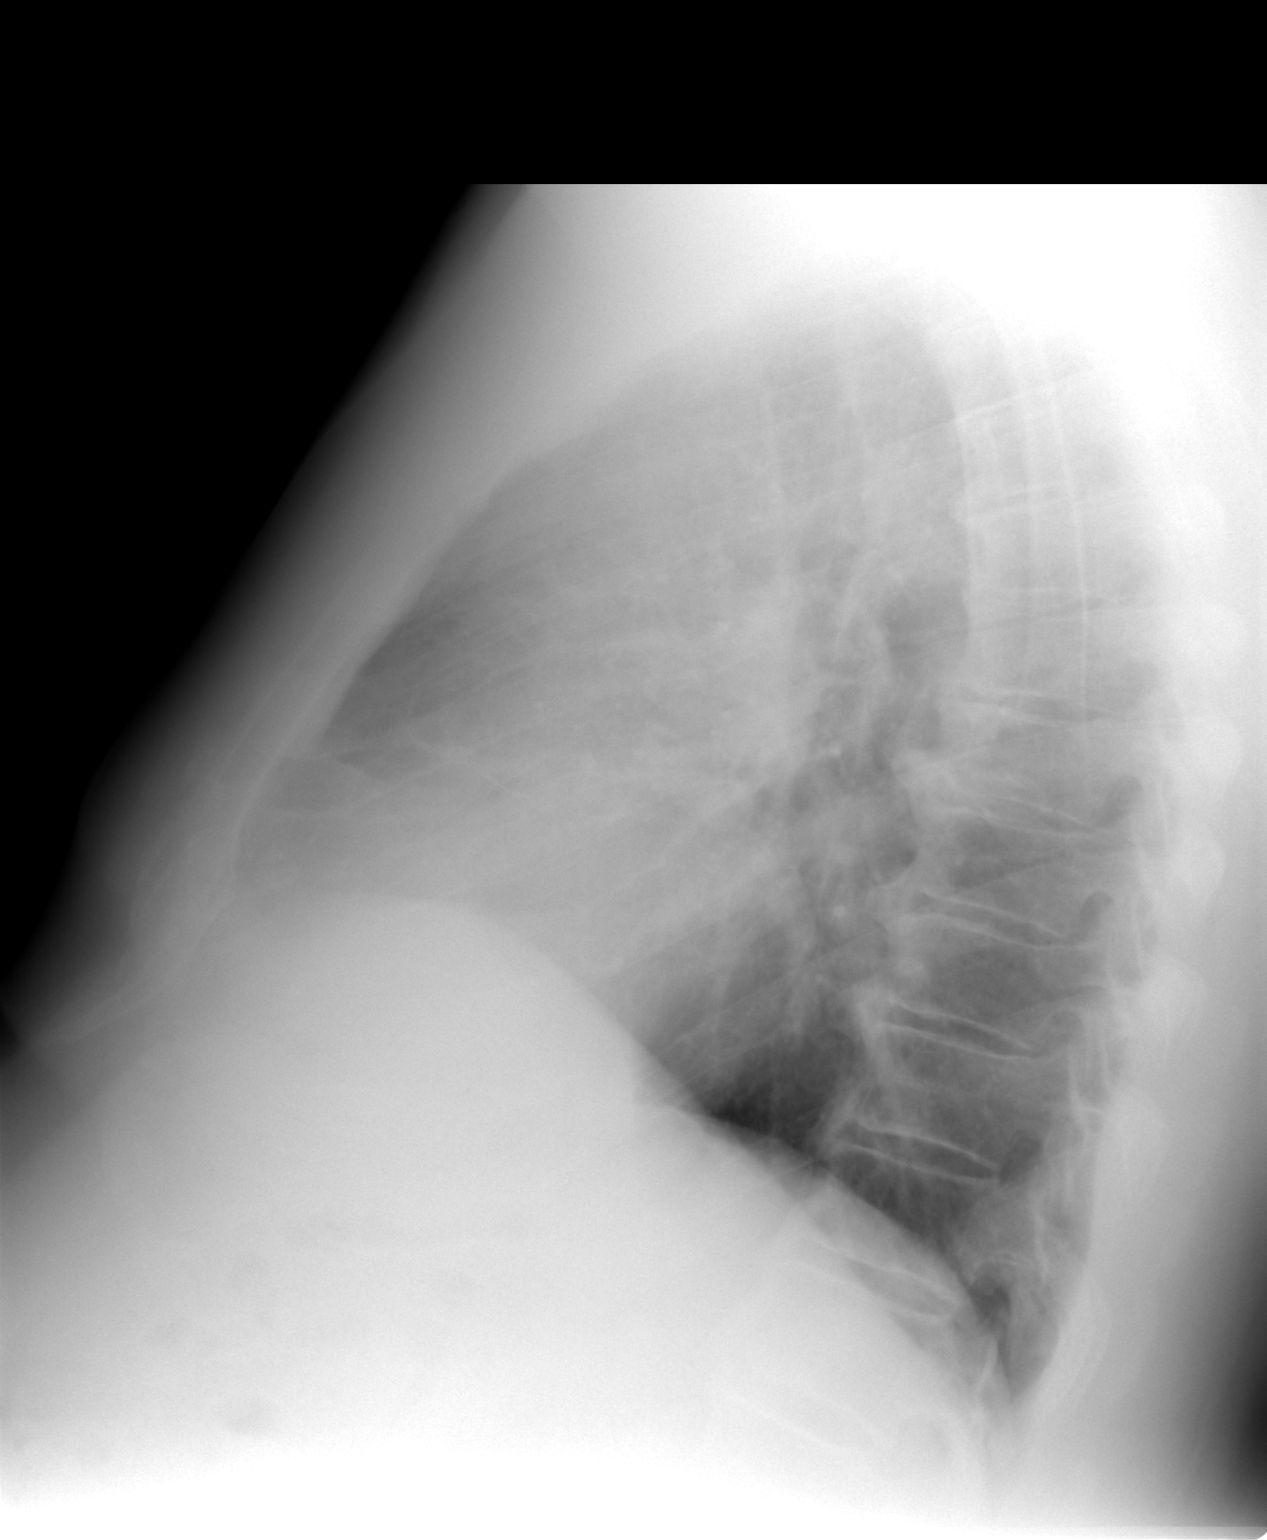

[2 of 2 positions shown; findings below may reference images not displayed]

FINDINGS: Normal heart size with clear lung fields.  No bony
abnormality.  No effusion or pneumothorax.  No change from priors.
IMPRESSION: Negative exam

## 2016-06-25 ENCOUNTER — Ambulatory Visit: Payer: Self-pay | Admitting: Family Medicine

## 2016-08-13 ENCOUNTER — Encounter: Payer: Self-pay | Admitting: Family Medicine

## 2016-08-13 ENCOUNTER — Ambulatory Visit (INDEPENDENT_AMBULATORY_CARE_PROVIDER_SITE_OTHER): Payer: Medicare HMO | Admitting: Family Medicine

## 2016-08-13 VITALS — BP 205/91 | HR 72 | Temp 98.2°F | Ht 68.0 in | Wt 281.0 lb

## 2016-08-13 DIAGNOSIS — I10 Essential (primary) hypertension: Secondary | ICD-10-CM

## 2016-08-13 DIAGNOSIS — E118 Type 2 diabetes mellitus with unspecified complications: Secondary | ICD-10-CM

## 2016-08-13 DIAGNOSIS — Z79899 Other long term (current) drug therapy: Secondary | ICD-10-CM | POA: Diagnosis not present

## 2016-08-13 DIAGNOSIS — Z20828 Contact with and (suspected) exposure to other viral communicable diseases: Secondary | ICD-10-CM

## 2016-08-13 DIAGNOSIS — G4733 Obstructive sleep apnea (adult) (pediatric): Secondary | ICD-10-CM | POA: Diagnosis not present

## 2016-08-13 DIAGNOSIS — Z0000 Encounter for general adult medical examination without abnormal findings: Secondary | ICD-10-CM | POA: Insufficient documentation

## 2016-08-13 DIAGNOSIS — E1159 Type 2 diabetes mellitus with other circulatory complications: Secondary | ICD-10-CM

## 2016-08-13 DIAGNOSIS — N521 Erectile dysfunction due to diseases classified elsewhere: Secondary | ICD-10-CM

## 2016-08-13 LAB — CBC
HEMATOCRIT: 43.7 % (ref 38.5–50.0)
HEMOGLOBIN: 14.9 g/dL (ref 13.2–17.1)
MCH: 31.6 pg (ref 27.0–33.0)
MCHC: 34.1 g/dL (ref 32.0–36.0)
MCV: 92.6 fL (ref 80.0–100.0)
MPV: 8.7 fL (ref 7.5–12.5)
Platelets: 237 10*3/uL (ref 140–400)
RBC: 4.72 MIL/uL (ref 4.20–5.80)
RDW: 13.9 % (ref 11.0–15.0)
WBC: 6.6 10*3/uL (ref 3.8–10.8)

## 2016-08-13 LAB — LIPID PANEL
CHOL/HDL RATIO: 4.5 ratio (ref <=5.0)
CHOLESTEROL: 176 mg/dL (ref 125–200)
HDL: 39 mg/dL — ABNORMAL LOW (ref >=40)
LDL CALC: 93 mg/dL (ref <130)
Triglycerides: 220 mg/dL — ABNORMAL HIGH (ref <150)
VLDL: 44 mg/dL — AB (ref <30)

## 2016-08-13 LAB — COMPLETE METABOLIC PANEL WITH GFR
ALT: 60 U/L — AB (ref 9–46)
AST: 40 U/L — AB (ref 10–35)
Albumin: 4.2 g/dL (ref 3.6–5.1)
Alkaline Phosphatase: 45 U/L (ref 40–115)
BUN: 10 mg/dL (ref 7–25)
CALCIUM: 9.8 mg/dL (ref 8.6–10.3)
CHLORIDE: 104 mmol/L (ref 98–110)
CO2: 28 mmol/L (ref 20–31)
CREATININE: 0.98 mg/dL (ref 0.70–1.25)
GFR, Est African American: >89 mL/min (ref >=60)
GFR, Est Non African American: 79 mL/min (ref >=60)
GLUCOSE: 91 mg/dL (ref 65–99)
Potassium: 4.1 mmol/L (ref 3.5–5.3)
SODIUM: 140 mmol/L (ref 135–146)
Total Bilirubin: 0.7 mg/dL (ref 0.2–1.2)
Total Protein: 7.3 g/dL (ref 6.1–8.1)

## 2016-08-13 LAB — POCT GLYCOSYLATED HEMOGLOBIN (HGB A1C): Hemoglobin A1C: 6.9

## 2016-08-13 LAB — TSH: TSH: 1.88 mIU/L (ref 0.40–4.50)

## 2016-08-13 MED ORDER — LISINOPRIL-HYDROCHLOROTHIAZIDE 20-12.5 MG PO TABS: 2.0000 | ORAL_TABLET | Freq: Every day | ORAL | 0 refills | Status: DC

## 2016-08-13 MED ORDER — METFORMIN HCL 1000 MG PO TABS: 1000.0000 mg | ORAL_TABLET | Freq: Two times a day (BID) | ORAL | 3 refills | Status: DC

## 2016-08-13 NOTE — Progress Notes (Signed)
HPI  CC: establishing care. Patient is here to establish care in our clinic. He states that he is been moving around the state due to recent retirement. He states that he overall feels well but has some chronic conditions that need to be monitored and treated. Patient endorses a history of type 2 diabetes which he states his been relatively well-controlled with metformin. He denies any long-term complications with this condition. Patient also endorses some high blood pressure which she has been treated previously for but has since run of this medication.  Patient denies any significant changes in his health status. He denies any recent nausea, vomiting, or diarrhea. He does endorse some loose stools that have gotten more regular since his previous PCP had increased his metformin dose. He denies any shortness of breath or chest pain. No hematochezia or melena. No numbness, weakness, or paresthesias.  Patient is a never smoker. And currently drinks alcohol. Patient also was previously diagnosed with sleep apnea and currently wears a CPAP regularly every night.  CC, SH/smoking status, and VS noted  Objective: BP (!) 205/91   Pulse 72   Temp 98.2 F (36.8 C) (Oral)   Ht 5\' 8"  (1.727 m)   Wt 281 lb (127.5 kg)   BMI 42.73 kg/m  Gen: NAD, alert, cooperative, and pleasant. Morbidly obese HEENT: NCAT, EOMI, PERRL CV: RRR, no murmur Resp: CTAB, no wheezes, non-labored Abd: SNTND, BS present, no guarding or organomegaly Ext: No edema, warm. Slight intention tremor noted on exam. Neuro: Alert and oriented, Speech clear, No gross deficits  Assessment and plan:  Type 2 diabetes with circulatory disorder causing erectile dysfunction Stable (assumed): Patient's A1c is 6.9 today. At this point I'll assume that this is near patient's baseline. He endorses good compliance with his medications. No changes to be made today.  Essential hypertension Worse: Patient's blood pressure is notably  elevated. He denies any symptoms at this time of hypertension. It is likely that patient's anxiety towards meeting a new provider has something to do with this value. Also patient has not been taking his antihypertensives due to running out of medication. - Initiating lisinopril-hydrochlorothiazide 20-12.5 mg tablets. Patient instructed to take one half tablet daily for 4 days. Increase this to one full tablet daily for 7 days. Then increase this again to 2 tablets daily after that. - Patient asked to recheck his blood pressure at his local pharmacy and contact us with this value in 2 weeks. - Recheck BMP at follow-up visit in one month  Healthcare maintenance Patient is establishing care with our clinic today. Patient states that he has had a colonoscopy in the past but had not had one in over 10 years. Patient is also in need of basic labs today. - Information for local practices that perform colonoscopies provided. Patient was asking for other means to check for colon cancer. I discussed the flexible sigmoidoscopy and annual Hemoccult tests. Patient is going to consider these and let me know if he would prefer either of those over a colonoscopy.   Orders Placed This Encounter  Procedures  . COMPLETE METABOLIC PANEL WITH GFR  . CBC  . TSH  . Lipid panel  . Hepatitis C antibody  . POCT glycosylated hemoglobin (Hb A1C)    Meds ordered this encounter  Medications  . metFORMIN (GLUCOPHAGE) 1000 MG tablet    Sig: Take 1 tablet (1,000 mg total) by mouth 2 (two) times daily with a meal.    Dispense:  180 tablet  Refill:  3  . lisinopril-hydrochlorothiazide (PRINZIDE,ZESTORETIC) 20-12.5 MG tablet    Sig: Take 2 tablets by mouth daily.    Dispense:  180 tablet    Refill:  0     Elberta Leatherwood, MD,MS,  PGY3 08/13/2016 5:07 PM

## 2016-08-13 NOTE — Assessment & Plan Note (Signed)
Patient is establishing care with our clinic today. Patient states that he has had a colonoscopy in the past but had not had one in over 10 years. Patient is also in need of basic labs today. - Information for local practices that perform colonoscopies provided. Patient was asking for other means to check for colon cancer. I discussed the flexible sigmoidoscopy and annual Hemoccult tests. Patient is going to consider these and let me know if he would prefer either of those over a colonoscopy.

## 2016-08-13 NOTE — Assessment & Plan Note (Signed)
Worse: Patient's blood pressure is notably elevated. He denies any symptoms at this time of hypertension. It is likely that patient's anxiety towards meeting a new provider has something to do with this value. Also patient has not been taking his antihypertensives due to running out of medication. - Initiating lisinopril-hydrochlorothiazide 20-12.5 mg tablets. Patient instructed to take one half tablet daily for 4 days. Increase this to one full tablet daily for 7 days. Then increase this again to 2 tablets daily after that. - Patient asked to recheck his blood pressure at his local pharmacy and contact us with this value in 2 weeks. - Recheck BMP at follow-up visit in one month

## 2016-08-13 NOTE — Assessment & Plan Note (Signed)
Stable (assumed): Patient's A1c is 6.9 today. At this point I'll assume that this is near patient's baseline. He endorses good compliance with his medications. No changes to be made today.

## 2016-08-13 NOTE — Patient Instructions (Addendum)
It was a pleasure seeing you today in our clinic. Today we discussed your medications and establishing care. Here is the treatment plan we have discussed and agreed upon together:   - I have started you on lisinopril hydrochlorothiazide. For the first 4 days take half a tablet every day. Then take 1 tablet for 1 week. Finally, take 2 tablets every day after that. - Continue taking metformin. 1 tablet twice a day with food. - I would like to see you back in one month to recheck your blood pressure, and discuss long-term healthcare goals. At that appointment we will discuss the results of today's labs. - I've provided you the contact information for nearby practices that perform colonoscopies. I would like for you to contact one of these offices to set up an appointment to have a colonoscopy performed.

## 2016-08-14 LAB — HEPATITIS C ANTIBODY: HCV AB: NEGATIVE

## 2016-09-10 ENCOUNTER — Encounter: Payer: Self-pay | Admitting: Internal Medicine

## 2016-09-10 ENCOUNTER — Ambulatory Visit: Payer: Medicare HMO | Admitting: Family Medicine

## 2016-09-10 ENCOUNTER — Ambulatory Visit (INDEPENDENT_AMBULATORY_CARE_PROVIDER_SITE_OTHER): Payer: Medicare HMO | Admitting: Internal Medicine

## 2016-09-10 DIAGNOSIS — E782 Mixed hyperlipidemia: Secondary | ICD-10-CM | POA: Diagnosis not present

## 2016-09-10 DIAGNOSIS — E1159 Type 2 diabetes mellitus with other circulatory complications: Secondary | ICD-10-CM | POA: Diagnosis not present

## 2016-09-10 DIAGNOSIS — I10 Essential (primary) hypertension: Secondary | ICD-10-CM

## 2016-09-10 DIAGNOSIS — N521 Erectile dysfunction due to diseases classified elsewhere: Secondary | ICD-10-CM

## 2016-09-10 DIAGNOSIS — J069 Acute upper respiratory infection, unspecified: Secondary | ICD-10-CM | POA: Insufficient documentation

## 2016-09-10 DIAGNOSIS — J00 Acute nasopharyngitis [common cold]: Secondary | ICD-10-CM

## 2016-09-10 MED ORDER — ATORVASTATIN CALCIUM 20 MG PO TABS: 20.0000 mg | ORAL_TABLET | Freq: Every day | ORAL | 3 refills | Status: AC

## 2016-09-10 MED ORDER — METFORMIN HCL 1000 MG PO TABS: 1000.0000 mg | ORAL_TABLET | Freq: Two times a day (BID) | ORAL | 3 refills | Status: DC

## 2016-09-10 MED ORDER — LISINOPRIL-HYDROCHLOROTHIAZIDE 20-25 MG PO TABS: 2.0000 | ORAL_TABLET | Freq: Every day | ORAL | 3 refills | Status: AC

## 2016-09-10 NOTE — Assessment & Plan Note (Signed)
Bp elevated in office today at 145/70, and patient with reported consistently elevated systolic BPs at home ([id number]). As such, will increase BP med dosage today. Cr WNL one month ago.  - Increase lisinopril-HCTZ to two 20-25mg  tablets qd - F/u in two months

## 2016-09-10 NOTE — Progress Notes (Signed)
Subjective:    Patient ID: [name], male    DOB: October 17, 1951, 65 y.o.   MRN: [id number]  HPI  Patient presents to follow-up on labwork and for recent cold symptoms.   Hypertension Occasionally measures BP at home and always has systolics in [id number] with diastolic in [id number]. Denies headaches, changes in vision. Is taking lisinopril-HCTZ 10-12.5mg  two tabs a day.   Hyperlipidemia Says that he was previously on atorvastatin when being seen at a previous practice, however this was stopped by his PCP at that time because his cholesterol had normalized. Is not currently taking any medication for cholesterol.   Diabetes Currently prescribed metformin 1000mg  BID, however reports that he often forgets to take the second pill. Does not check blood sugar at home. Denies vision changes. Last saw ophthalmologist in 11/2015. Denies numbness or tingling in extremities.   Cough/congestion Began three days ago. Reports rhinorrhea and non-productive cough. Denies fevers or chills. Normal appetite. Has not taken anything at home to help with symptoms. Is concerned because he has developed bronchitis in the past and wants to be sure his current symptoms do not progress to that.   Smoking status reviewed.   Review of Systems See HPI.     Objective:   Physical Exam  Constitutional: He is oriented to person, place, and time. He appears well-developed and well-nourished. No distress.  HENT:  Head: Normocephalic and atraumatic.  Eyes: Conjunctivae and EOM are normal. Right eye exhibits no discharge. Left eye exhibits no discharge.  Pulmonary/Chest: Effort normal and breath sounds normal. No respiratory distress. He has no wheezes.  Neurological: He is alert and oriented to person, place, and time.  Skin: Skin is warm and dry.  Psychiatric: He has a normal mood and affect. His behavior is normal.  Vitals reviewed.  Diabetic Foot Exam - Simple   Simple Foot Form Diabetic Foot exam was  performed with the following findings:  Yes 09/10/2016 11:46 AM  Visual Inspection No deformities, no ulcerations, no other skin breakdown bilaterally:  Yes Sensation Testing Intact to touch and monofilament testing bilaterally:  Yes Pulse Check Posterior Tibialis and Dorsalis pulse intact bilaterally:  Yes Comments       Assessment & Plan:  Essential hypertension Bp elevated in office today at 145/70, and patient with reported consistently elevated systolic BPs at home ([id number]). As such, will increase BP med dosage today. Cr WNL one month ago.  - Increase lisinopril-HCTZ to two 20-25mg  tablets qd - F/u in two months  Type 2 diabetes with circulatory disorder causing erectile dysfunction A1C 6.9 on 10/19. Does not check blood sugar at home, though slightly elevated at 130 on 10/19. No visual symptoms and has seen ophtho in past year. No abnormalities on foot exam.  - Continue metformin 1000mg  BID - Encouraged patient to take exactly as prescribed since A1C is borderline - F/u for repeat A1C in two months  Hyperlipidemia Elevated triglycerides and VLDL on last lipid panel on 10/19. Patient not currently medically treated, however ASCVD 10-year risk 37.9%, suggesting patient should be on statin. Patient agreeable to resuming atorvastatin that he was previously on. Will resume at prior dose, and adjust as needed.  - Resume atorvastatin 20mg  qd  Upper respiratory tract infection Non-productive cough and rhinorrhea with clear lung exam most consistent with viral URI. Afebrile and well-appearing on exam. O2 sat 96% on RA. Has not tried anything at home to help with symptoms.  - Recommended honey and/or Delsym for cough and OTC  meds for congestion (Mucinex) - As lungs CTAB and patient well-appearing, do not feel that abx are indicated at this time  Adin Hector, MD, MPH PGY-2 [facility] Pager [phone]

## 2016-09-10 NOTE — Assessment & Plan Note (Signed)
A1C 6.9 on 10/19. Does not check blood sugar at home, though slightly elevated at 130 on 10/19. No visual symptoms and has seen ophtho in past year. No abnormalities on foot exam.  - Continue metformin 1000mg  BID - Encouraged patient to take exactly as prescribed since A1C is borderline - F/u for repeat A1C in two months

## 2016-09-10 NOTE — Assessment & Plan Note (Signed)
Elevated triglycerides and VLDL on last lipid panel on 10/19. Patient not currently medically treated, however ASCVD 10-year risk 37.9%, suggesting patient should be on statin. Patient agreeable to resuming atorvastatin that he was previously on. Will resume at prior dose, and adjust as needed.  - Resume atorvastatin 20mg  qd

## 2016-09-10 NOTE — Patient Instructions (Signed)
It was nice meeting you today [name]!  For your blood pressure, please begin taking the increased dose of lisinopril-HCTZ I have prescribed. You will still take two pills a day, but the new prescription is for a higher dose of medication (lisinopril 20mg  - HCTZ 25mg ).   For cholesterol, please begin taking atorvastatin 20mg  daily.   Please continue to take metformin 1000mg  twice a day as prescribed.   For your cough, you can eat a spoonful of honey, either alone or mixed in a warm beverage, a few times a day. You can also use over the counter Delsym. For congestion, you can try over the counter Mucinex.   We will see you back in two months to follow up on your high blood pressure and diabetes.   If you have any questions or concerns, please feel free to call the clinic.   Be well,  Dr. Avon Gully

## 2016-09-10 NOTE — Assessment & Plan Note (Signed)
Non-productive cough and rhinorrhea with clear lung exam most consistent with viral URI. Afebrile and well-appearing on exam. O2 sat 96% on RA. Has not tried anything at home to help with symptoms.  - Recommended honey and/or Delsym for cough and OTC meds for congestion (Mucinex) - As lungs CTAB and patient well-appearing, do not feel that abx are indicated at this time

## 2016-09-14 ENCOUNTER — Ambulatory Visit: Payer: Medicare HMO | Admitting: Family Medicine

## 2017-02-23 ENCOUNTER — Ambulatory Visit
Admission: EM | Admit: 2017-02-23 | Discharge: 2017-02-23 | Disposition: A | Discharge status: Home / Self Care | Payer: PPO | Attending: Family Medicine | Admitting: Family Medicine

## 2017-02-23 DIAGNOSIS — K625 Hemorrhage of anus and rectum: Secondary | ICD-10-CM | POA: Diagnosis not present

## 2017-02-23 DIAGNOSIS — K645 Perianal venous thrombosis: Secondary | ICD-10-CM | POA: Diagnosis not present

## 2017-02-23 MED ORDER — HYDROCORTISONE 2.5 % RE CREA: 1.0000 "application " | TOPICAL_CREAM | Freq: Two times a day (BID) | RECTAL | 0 refills | Status: DC
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
## 2017-02-23 NOTE — ED Triage Notes (Signed)
Patient complains of hemorrhoid that he has noticed since Friday. Patient states that he used the bathroom this morning (130am) and when he woke up this morning (630am) and noticed blood in the bed.

## 2017-02-23 NOTE — Discharge Instructions (Signed)
Please use Metamucil twice a day and also the Anusol cream twice a day. Recommend follow-up with the surgeon at [facility] here in [facility] please call for an appointment today. Recommend strongly getting a colonoscopy this year after seeing PCP.

## 2017-07-14 DIAGNOSIS — Z1211 Encounter for screening for malignant neoplasm of colon: Secondary | ICD-10-CM | POA: Diagnosis not present

## 2017-07-14 DIAGNOSIS — Z6841 Body Mass Index (BMI) 40.0 and over, adult: Secondary | ICD-10-CM | POA: Diagnosis not present

## 2017-07-14 DIAGNOSIS — G8929 Other chronic pain: Secondary | ICD-10-CM | POA: Diagnosis not present

## 2017-07-14 DIAGNOSIS — G4733 Obstructive sleep apnea (adult) (pediatric): Secondary | ICD-10-CM | POA: Diagnosis not present

## 2017-07-14 DIAGNOSIS — Z7689 Persons encountering health services in other specified circumstances: Secondary | ICD-10-CM | POA: Diagnosis not present

## 2017-07-14 DIAGNOSIS — I10 Essential (primary) hypertension: Secondary | ICD-10-CM | POA: Diagnosis not present

## 2017-07-14 DIAGNOSIS — Z23 Encounter for immunization: Secondary | ICD-10-CM | POA: Diagnosis not present

## 2017-07-14 DIAGNOSIS — E119 Type 2 diabetes mellitus without complications: Secondary | ICD-10-CM | POA: Insufficient documentation

## 2017-07-14 DIAGNOSIS — M25512 Pain in left shoulder: Secondary | ICD-10-CM | POA: Diagnosis not present

## 2017-08-19 DIAGNOSIS — J0100 Acute maxillary sinusitis, unspecified: Secondary | ICD-10-CM | POA: Diagnosis not present

## 2017-09-09 DIAGNOSIS — R062 Wheezing: Secondary | ICD-10-CM | POA: Diagnosis not present

## 2017-09-09 DIAGNOSIS — R05 Cough: Secondary | ICD-10-CM | POA: Diagnosis not present

## 2017-09-09 DIAGNOSIS — Z1211 Encounter for screening for malignant neoplasm of colon: Secondary | ICD-10-CM | POA: Diagnosis not present

## 2017-09-13 DIAGNOSIS — Z6841 Body Mass Index (BMI) 40.0 and over, adult: Secondary | ICD-10-CM | POA: Diagnosis not present

## 2017-09-13 DIAGNOSIS — R05 Cough: Secondary | ICD-10-CM | POA: Diagnosis not present

## 2017-09-13 DIAGNOSIS — J209 Acute bronchitis, unspecified: Secondary | ICD-10-CM | POA: Diagnosis not present

## 2017-09-13 DIAGNOSIS — G4733 Obstructive sleep apnea (adult) (pediatric): Secondary | ICD-10-CM | POA: Diagnosis not present

## 2017-09-13 DIAGNOSIS — R0683 Snoring: Secondary | ICD-10-CM | POA: Diagnosis not present

## 2017-09-13 DIAGNOSIS — G8929 Other chronic pain: Secondary | ICD-10-CM | POA: Diagnosis not present

## 2017-09-13 DIAGNOSIS — I10 Essential (primary) hypertension: Secondary | ICD-10-CM | POA: Diagnosis not present

## 2017-09-13 DIAGNOSIS — M25512 Pain in left shoulder: Secondary | ICD-10-CM | POA: Diagnosis not present

## 2017-10-13 DIAGNOSIS — G4733 Obstructive sleep apnea (adult) (pediatric): Secondary | ICD-10-CM | POA: Diagnosis not present

## 2017-10-25 ENCOUNTER — Encounter: Payer: Self-pay | Admitting: *Deleted

## 2017-10-27 ENCOUNTER — Ambulatory Visit: Payer: PPO | Admitting: Anesthesiology

## 2017-10-27 ENCOUNTER — Ambulatory Visit
Admission: RE | Admit: 2017-10-27 | Discharge: 2017-10-27 | Disposition: A | Discharge status: Home / Self Care | Payer: PPO | Source: Ambulatory Visit | Attending: Unknown Physician Specialty | Admitting: Unknown Physician Specialty

## 2017-10-27 ENCOUNTER — Encounter
Admission: RE | Disposition: A | Discharge status: Home / Self Care | Payer: Self-pay | Source: Ambulatory Visit | Attending: Unknown Physician Specialty

## 2017-10-27 ENCOUNTER — Encounter: Payer: Self-pay | Admitting: *Deleted

## 2017-10-27 DIAGNOSIS — E785 Hyperlipidemia, unspecified: Secondary | ICD-10-CM | POA: Diagnosis not present

## 2017-10-27 DIAGNOSIS — Z7984 Long term (current) use of oral hypoglycemic drugs: Secondary | ICD-10-CM | POA: Diagnosis not present

## 2017-10-27 DIAGNOSIS — K635 Polyp of colon: Secondary | ICD-10-CM | POA: Diagnosis not present

## 2017-10-27 DIAGNOSIS — Z79899 Other long term (current) drug therapy: Secondary | ICD-10-CM | POA: Insufficient documentation

## 2017-10-27 DIAGNOSIS — Z1211 Encounter for screening for malignant neoplasm of colon: Secondary | ICD-10-CM | POA: Insufficient documentation

## 2017-10-27 DIAGNOSIS — I10 Essential (primary) hypertension: Secondary | ICD-10-CM | POA: Diagnosis not present

## 2017-10-27 DIAGNOSIS — M549 Dorsalgia, unspecified: Secondary | ICD-10-CM | POA: Insufficient documentation

## 2017-10-27 DIAGNOSIS — Z801 Family history of malignant neoplasm of trachea, bronchus and lung: Secondary | ICD-10-CM | POA: Diagnosis not present

## 2017-10-27 DIAGNOSIS — Z881 Allergy status to other antibiotic agents status: Secondary | ICD-10-CM | POA: Diagnosis not present

## 2017-10-27 DIAGNOSIS — K5730 Diverticulosis of large intestine without perforation or abscess without bleeding: Secondary | ICD-10-CM | POA: Diagnosis not present

## 2017-10-27 DIAGNOSIS — K6389 Other specified diseases of intestine: Secondary | ICD-10-CM | POA: Diagnosis not present

## 2017-10-27 DIAGNOSIS — Z6841 Body Mass Index (BMI) 40.0 and over, adult: Secondary | ICD-10-CM | POA: Diagnosis not present

## 2017-10-27 DIAGNOSIS — G4733 Obstructive sleep apnea (adult) (pediatric): Secondary | ICD-10-CM | POA: Diagnosis not present

## 2017-10-27 DIAGNOSIS — Z823 Family history of stroke: Secondary | ICD-10-CM | POA: Diagnosis not present

## 2017-10-27 DIAGNOSIS — E1159 Type 2 diabetes mellitus with other circulatory complications: Secondary | ICD-10-CM | POA: Diagnosis not present

## 2017-10-27 DIAGNOSIS — Z880 Allergy status to penicillin: Secondary | ICD-10-CM | POA: Insufficient documentation

## 2017-10-27 DIAGNOSIS — Z8249 Family history of ischemic heart disease and other diseases of the circulatory system: Secondary | ICD-10-CM | POA: Diagnosis not present

## 2017-10-27 DIAGNOSIS — E669 Obesity, unspecified: Secondary | ICD-10-CM | POA: Diagnosis not present

## 2017-10-27 DIAGNOSIS — D122 Benign neoplasm of ascending colon: Secondary | ICD-10-CM | POA: Insufficient documentation

## 2017-10-27 DIAGNOSIS — K5790 Diverticulosis of intestine, part unspecified, without perforation or abscess without bleeding: Secondary | ICD-10-CM | POA: Diagnosis not present

## 2017-10-27 DIAGNOSIS — E119 Type 2 diabetes mellitus without complications: Secondary | ICD-10-CM | POA: Insufficient documentation

## 2017-10-27 DIAGNOSIS — Z7982 Long term (current) use of aspirin: Secondary | ICD-10-CM | POA: Insufficient documentation

## 2017-10-27 DIAGNOSIS — I739 Peripheral vascular disease, unspecified: Secondary | ICD-10-CM | POA: Diagnosis not present

## 2017-10-27 DIAGNOSIS — K640 First degree hemorrhoids: Secondary | ICD-10-CM | POA: Diagnosis not present

## 2017-10-27 DIAGNOSIS — D125 Benign neoplasm of sigmoid colon: Secondary | ICD-10-CM | POA: Diagnosis not present

## 2017-10-27 DIAGNOSIS — J449 Chronic obstructive pulmonary disease, unspecified: Secondary | ICD-10-CM | POA: Diagnosis not present

## 2017-10-27 DIAGNOSIS — K648 Other hemorrhoids: Secondary | ICD-10-CM | POA: Diagnosis not present

## 2017-10-27 HISTORY — PX: COLONOSCOPY WITH PROPOFOL: SHX5780

## 2017-10-27 HISTORY — DX: Chronic obstructive pulmonary disease, unspecified: J44.9

## 2017-10-27 HISTORY — DX: Obesity, unspecified: E66.9

## 2017-10-27 LAB — GLUCOSE, CAPILLARY: GLUCOSE-CAPILLARY: 217 mg/dL — AB (ref 65–99)

## 2017-10-27 SURGERY — COLONOSCOPY WITH PROPOFOL
Anesthesia: General

## 2017-10-27 MED ORDER — ONDANSETRON HCL 4 MG/2ML IJ SOLN
INTRAMUSCULAR | Status: AC
  Administered 2017-10-27: 4 mg via INTRAVENOUS
  Filled 2017-10-27: qty 2

## 2017-10-27 MED ORDER — SODIUM CHLORIDE 0.9 % IV SOLN
INTRAVENOUS | Status: DC
  Administered 2017-10-27 (×2): via INTRAVENOUS

## 2017-10-27 MED ORDER — PROPOFOL 500 MG/50ML IV EMUL
INTRAVENOUS | Status: DC | PRN
  Administered 2017-10-27: 100 ug/kg/min via INTRAVENOUS

## 2017-10-27 MED ORDER — SODIUM CHLORIDE 0.9 % IV SOLN: INTRAVENOUS | Status: DC

## 2017-10-27 MED ORDER — PROPOFOL 10 MG/ML IV BOLUS
INTRAVENOUS | Status: DC | PRN
  Administered 2017-10-27 (×3): 20 mg via INTRAVENOUS

## 2017-10-27 MED ORDER — ONDANSETRON HCL 4 MG/2ML IJ SOLN
4.0000 mg | Freq: Once | INTRAMUSCULAR | Status: AC
  Administered 2017-10-27: 4 mg via INTRAVENOUS
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
## 2017-10-27 NOTE — Anesthesia Post-op Follow-up Note (Signed)
Anesthesia QCDR form completed.        

## 2017-10-27 NOTE — Op Note (Signed)
[facility] Patient Name: [name] Procedure Date: 10/27/2017 9:40 AM MRN: [id number] Account #: [id number] Date of Birth: 1951-10-15 Admit Type: Outpatient Age: 68 Room: [facility] ENDO ROOM 3 Gender: Male Note Status: Finalized Procedure:            Colonoscopy Indications:          Screening for colorectal malignant neoplasm Providers:            Manya Silvas, MD Medicines:            Propofol per Anesthesia Complications:        No immediate complications. Procedure:            Pre-Anesthesia Assessment:                       - After reviewing the risks and benefits, the patient                        was deemed in satisfactory condition to undergo the                        procedure.                       After obtaining informed consent, the colonoscope was                        passed under direct vision. Throughout the procedure,                        the patient's blood pressure, pulse, and oxygen                        saturations were monitored continuously. The                        Colonoscope was introduced through the anus and                        advanced to the the cecum, identified by appendiceal                        orifice and ileocecal valve. The colonoscopy was                        performed without difficulty. The patient tolerated the                        procedure well. The quality of the bowel preparation                        was excellent. Findings:      A small polyp was found in the ascending colon. The polyp was sessile.       The polyp was removed with a hot snare. Resection and retrieval were       complete.      A small polyp was found in the sigmoid colon. The polyp was sessile. The       polyp was removed with a hot snare. Resection and retrieval were       complete.      Many small-mouthed diverticula were found in  the sigmoid colon.      Internal hemorrhoids were found during endoscopy. The  hemorrhoids were       small and Grade I (internal hemorrhoids that do not prolapse). Impression:           - One small polyp in the ascending colon, removed with                        a hot snare. Resected and retrieved.                       - One small polyp in the sigmoid colon, removed with a                        hot snare. Resected and retrieved.                       - Diverticulosis in the sigmoid colon.                       - Internal hemorrhoids. Recommendation:       - Await pathology results. Manya Silvas, MD 10/27/2017 10:30:30 AM This report has been signed electronically. Number of Addenda: 0 Note Initiated On: 10/27/2017 9:40 AM Scope Withdrawal Time: 0 hours 12 minutes 29 seconds  Total Procedure Duration: 0 hours 18 minutes 3 seconds       [facility]

## 2017-10-27 NOTE — Transfer of Care (Signed)
Immediate Anesthesia Transfer of Care Note  Patient: [name]  Procedure(s) Performed: COLONOSCOPY WITH PROPOFOL (N/A )  Patient Location: PACU  Anesthesia Type:General  Level of Consciousness: awake, alert  and oriented  Airway & Oxygen Therapy: Patient Spontanous Breathing  Post-op Assessment: Report given to RN  Post vital signs: Reviewed and stable  Last Vitals:  Vitals:   10/27/17 0941 10/27/17 1032  BP: (!) 203/86 128/78  Pulse: 73 64  Resp: 18 20  Temp: 36.8 C 36.8 C  SpO2: 96% 96%    Last Pain:  Vitals:   10/27/17 0941  TempSrc: Tympanic         Complications: No apparent anesthesia complications
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
## 2017-10-28 ENCOUNTER — Encounter: Payer: Self-pay | Admitting: Unknown Physician Specialty

## 2017-10-28 LAB — SURGICAL PATHOLOGY

## 2017-10-28 NOTE — Anesthesia Postprocedure Evaluation (Signed)
Anesthesia Post Note  Patient: [name]  Procedure(s) Performed: COLONOSCOPY WITH PROPOFOL (N/A )  Patient location during evaluation: PACU Anesthesia Type: General Level of consciousness: awake and alert Pain management: pain level controlled Vital Signs Assessment: post-procedure vital signs reviewed and stable Respiratory status: spontaneous breathing, nonlabored ventilation, respiratory function stable and patient connected to nasal cannula oxygen Cardiovascular status: blood pressure returned to baseline and stable Postop Assessment: no apparent nausea or vomiting Anesthetic complications: no     Last Vitals:  Vitals:   10/27/17 1040 10/27/17 1100  BP: (!) 149/92 (!) 146/76  Pulse:    Resp:    Temp:    SpO2:      Last Pain:  Vitals:   10/27/17 1030  TempSrc: Tympanic                 Molli Barrows

## 2017-11-11 DIAGNOSIS — G4733 Obstructive sleep apnea (adult) (pediatric): Secondary | ICD-10-CM | POA: Diagnosis not present

## 2017-11-11 DIAGNOSIS — J069 Acute upper respiratory infection, unspecified: Secondary | ICD-10-CM | POA: Diagnosis not present

## 2017-11-11 DIAGNOSIS — I10 Essential (primary) hypertension: Secondary | ICD-10-CM | POA: Diagnosis not present

## 2017-11-11 DIAGNOSIS — Z6841 Body Mass Index (BMI) 40.0 and over, adult: Secondary | ICD-10-CM | POA: Diagnosis not present

## 2017-11-11 DIAGNOSIS — E119 Type 2 diabetes mellitus without complications: Secondary | ICD-10-CM | POA: Diagnosis not present

## 2017-12-06 DIAGNOSIS — Z6841 Body Mass Index (BMI) 40.0 and over, adult: Secondary | ICD-10-CM | POA: Diagnosis not present

## 2017-12-06 DIAGNOSIS — J209 Acute bronchitis, unspecified: Secondary | ICD-10-CM | POA: Diagnosis not present

## 2017-12-06 DIAGNOSIS — M25512 Pain in left shoulder: Secondary | ICD-10-CM | POA: Diagnosis not present

## 2017-12-06 DIAGNOSIS — G8929 Other chronic pain: Secondary | ICD-10-CM | POA: Diagnosis not present

## 2017-12-06 DIAGNOSIS — G4733 Obstructive sleep apnea (adult) (pediatric): Secondary | ICD-10-CM | POA: Diagnosis not present

## 2017-12-06 DIAGNOSIS — I10 Essential (primary) hypertension: Secondary | ICD-10-CM | POA: Diagnosis not present

## 2017-12-06 DIAGNOSIS — R0683 Snoring: Secondary | ICD-10-CM | POA: Diagnosis not present

## 2017-12-13 DIAGNOSIS — E119 Type 2 diabetes mellitus without complications: Secondary | ICD-10-CM | POA: Diagnosis not present

## 2017-12-13 DIAGNOSIS — G4733 Obstructive sleep apnea (adult) (pediatric): Secondary | ICD-10-CM | POA: Diagnosis not present

## 2017-12-13 DIAGNOSIS — I10 Essential (primary) hypertension: Secondary | ICD-10-CM | POA: Diagnosis not present

## 2017-12-13 DIAGNOSIS — Z6841 Body Mass Index (BMI) 40.0 and over, adult: Secondary | ICD-10-CM | POA: Diagnosis not present

## 2017-12-14 DIAGNOSIS — G4733 Obstructive sleep apnea (adult) (pediatric): Secondary | ICD-10-CM | POA: Diagnosis not present

## 2017-12-27 DIAGNOSIS — G4733 Obstructive sleep apnea (adult) (pediatric): Secondary | ICD-10-CM | POA: Diagnosis not present

## 2017-12-27 DIAGNOSIS — I10 Essential (primary) hypertension: Secondary | ICD-10-CM | POA: Diagnosis not present

## 2017-12-27 DIAGNOSIS — E119 Type 2 diabetes mellitus without complications: Secondary | ICD-10-CM | POA: Diagnosis not present

## 2017-12-27 DIAGNOSIS — Z6841 Body Mass Index (BMI) 40.0 and over, adult: Secondary | ICD-10-CM | POA: Diagnosis not present

## 2018-01-11 DIAGNOSIS — G4733 Obstructive sleep apnea (adult) (pediatric): Secondary | ICD-10-CM | POA: Diagnosis not present

## 2018-01-27 DIAGNOSIS — E119 Type 2 diabetes mellitus without complications: Secondary | ICD-10-CM | POA: Diagnosis not present

## 2018-01-27 DIAGNOSIS — E162 Hypoglycemia, unspecified: Secondary | ICD-10-CM | POA: Diagnosis not present

## 2018-01-27 DIAGNOSIS — G4733 Obstructive sleep apnea (adult) (pediatric): Secondary | ICD-10-CM | POA: Diagnosis not present

## 2018-01-27 DIAGNOSIS — J069 Acute upper respiratory infection, unspecified: Secondary | ICD-10-CM | POA: Diagnosis not present

## 2018-01-27 DIAGNOSIS — I10 Essential (primary) hypertension: Secondary | ICD-10-CM | POA: Diagnosis not present

## 2018-02-11 DIAGNOSIS — G4733 Obstructive sleep apnea (adult) (pediatric): Secondary | ICD-10-CM | POA: Diagnosis not present

## 2018-02-20 ENCOUNTER — Ambulatory Visit: Payer: PPO

## 2018-02-20 ENCOUNTER — Other Ambulatory Visit: Payer: Self-pay

## 2018-02-20 ENCOUNTER — Encounter: Payer: Self-pay | Admitting: Emergency Medicine

## 2018-02-20 ENCOUNTER — Ambulatory Visit
Admission: EM | Admit: 2018-02-20 | Discharge: 2018-02-20 | Disposition: A | Discharge status: Home / Self Care | Payer: PPO | Attending: Family Medicine | Admitting: Family Medicine

## 2018-02-20 DIAGNOSIS — J0101 Acute recurrent maxillary sinusitis: Secondary | ICD-10-CM | POA: Diagnosis not present

## 2018-02-20 DIAGNOSIS — J40 Bronchitis, not specified as acute or chronic: Secondary | ICD-10-CM | POA: Diagnosis not present

## 2018-02-20 DIAGNOSIS — R05 Cough: Secondary | ICD-10-CM | POA: Diagnosis not present

## 2018-02-20 MED ORDER — ALBUTEROL SULFATE HFA 108 (90 BASE) MCG/ACT IN AERS: 1.0000 | INHALATION_SPRAY | Freq: Four times a day (QID) | RESPIRATORY_TRACT | 0 refills | Status: DC | PRN

## 2018-02-20 MED ORDER — LEVOFLOXACIN 500 MG PO TABS: 500.0000 mg | ORAL_TABLET | Freq: Every day | ORAL | 0 refills | Status: DC

## 2018-02-20 MED ORDER — PREDNISONE 20 MG PO TABS: 40.0000 mg | ORAL_TABLET | Freq: Every day | ORAL | 0 refills | Status: DC

## 2018-02-20 NOTE — ED Triage Notes (Addendum)
Patient in today c/o nasal/sinus congestion, cough and fatigue off & on for 3 months. Patient states that he gets an antibiotic and feels better, but as soon as he stops the antibiotic he gets sick again. Patient denies fever, but does feel flush this morning.
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
## 2018-02-20 NOTE — Discharge Instructions (Signed)
Please take antibiotics, prednisone as prescribed.  Use albuterol inhaler as needed.  Return to the clinic or primary care office for any worsening symptoms or urgent changes in health.

## 2018-03-13 DIAGNOSIS — G4733 Obstructive sleep apnea (adult) (pediatric): Secondary | ICD-10-CM | POA: Diagnosis not present

## 2018-04-06 DIAGNOSIS — Z6841 Body Mass Index (BMI) 40.0 and over, adult: Secondary | ICD-10-CM | POA: Diagnosis not present

## 2018-04-06 DIAGNOSIS — G4733 Obstructive sleep apnea (adult) (pediatric): Secondary | ICD-10-CM | POA: Diagnosis not present

## 2018-04-06 DIAGNOSIS — E119 Type 2 diabetes mellitus without complications: Secondary | ICD-10-CM | POA: Diagnosis not present

## 2018-04-06 DIAGNOSIS — I10 Essential (primary) hypertension: Secondary | ICD-10-CM | POA: Diagnosis not present

## 2018-04-06 DIAGNOSIS — Z125 Encounter for screening for malignant neoplasm of prostate: Secondary | ICD-10-CM | POA: Diagnosis not present

## 2018-04-12 DIAGNOSIS — E119 Type 2 diabetes mellitus without complications: Secondary | ICD-10-CM | POA: Diagnosis not present

## 2018-04-12 DIAGNOSIS — Z6841 Body Mass Index (BMI) 40.0 and over, adult: Secondary | ICD-10-CM | POA: Diagnosis not present

## 2018-04-12 DIAGNOSIS — G4733 Obstructive sleep apnea (adult) (pediatric): Secondary | ICD-10-CM | POA: Diagnosis not present

## 2018-04-12 DIAGNOSIS — I10 Essential (primary) hypertension: Secondary | ICD-10-CM | POA: Diagnosis not present

## 2018-04-13 DIAGNOSIS — G4733 Obstructive sleep apnea (adult) (pediatric): Secondary | ICD-10-CM | POA: Diagnosis not present

## 2018-05-13 DIAGNOSIS — G4733 Obstructive sleep apnea (adult) (pediatric): Secondary | ICD-10-CM | POA: Diagnosis not present

## 2018-06-08 ENCOUNTER — Other Ambulatory Visit: Payer: Self-pay | Admitting: *Deleted

## 2018-06-08 NOTE — Patient Outreach (Addendum)
[place] [facility]) Care Management  06/08/2018  [name] [birth year] [id number]   Telephone Screen  Referral Date: 06/07/18 Referral Source: episource referral  Referral Reason: chronic disease management, recently diagnosed chronic condition requiring services- Diabetes nutrition counseling  Insurance: [employer] team advantage (HTA)  Outreach attempt #1 No answer. [facility] RN CM left HIPAA compliant voicemail message along with CM's contact info  Conditions type 2 DM, bronchitis, morbid obesity with BMI 35-39.9 and co morbid BMI 41 HTN, seen for comprehensive physical exam and first health risk assessment on 06/06/18 by episource  Plan: [facility] RN CM sent an unsuccessful outreach letter and scheduled this patient for another call attempt within 4 business days   Kimberly L. Lavina Hamman, RN, BSN, [place] Management Care Coordinator Direct Number [phone] Mobile number [phone]  Main [facility] number [phone] Fax number [phone]

## 2018-06-13 ENCOUNTER — Other Ambulatory Visit: Payer: Self-pay | Admitting: *Deleted

## 2018-06-13 DIAGNOSIS — G4733 Obstructive sleep apnea (adult) (pediatric): Secondary | ICD-10-CM | POA: Diagnosis not present

## 2018-06-13 NOTE — Patient Outreach (Signed)
[place] [facility]) Care Management  06/13/2018  BRISCOE DANIELLO 21-Sep-1951 [id number]   Telephone Screen  Referral Date: 06/07/18 Referral Source: episource referral  Referral Reason: chronic disease management, recently diagnosed chronic condition requiring services- Diabetes nutrition counseling  Insurance: [employer] team advantage (HTA)  Outreach attempt #2 No answer. [facility] RN CM left HIPAA compliant voicemail message along with CM's contact info Mr Barrales had returned a call to [facility] RN CM and left a voice message on 06/09/18 confirming the correct number to reach him  Conditions type 2 DM, bronchitis, morbid obesity with BMI 35-39.9 and co morbid BMI 41 HTN, seen for comprehensive physical exam and first health risk assessment on 06/06/18 by episource  Plan: [facility] RN CM sent an unsuccessful outreach letter on 06/08/18  [facility] RN CM scheduled this patient for a third call attempt within 4 business days   [place] L. Lavina Hamman, RN, BSN, [place] Management Care Coordinator Direct Number [phone] Mobile number [phone]  Main [facility] number [phone] Fax number [phone]

## 2018-06-15 ENCOUNTER — Other Ambulatory Visit: Payer: Self-pay | Admitting: *Deleted

## 2018-06-15 NOTE — Patient Outreach (Signed)
[place] [facility]) Care Management  06/15/2018  [name] Apr 22, 1951 [id number]   Telephone Screen  Referral Date:06/07/18 Referral Source:episource referral Referral Reason:chronic disease management, recently diagnosed chronic condition requiring services- Diabetes nutrition counseling Insurance: Health team advantage (HTA)  Outreach attempt #3 No answer. [facility] RN CM left HIPAA compliant voicemail message along with CM's contact info   Conditions type 2 DM, bronchitis, morbid obesity with BMI 35-39.9 and co morbid BMI 41 HTN, seen for comprehensive physical exam and first health risk assessment on 06/06/18 by episource  Plan: [facility] RN CM sent an unsuccessful outreach letter on 06/08/18  [facility] RN CM scheduled this patient for a case closure if no response   Kimberly L. Lavina Hamman, RN, BSN, [place] Management Care Coordinator Direct Number [phone] Mobile number [phone]  Main [facility] number [phone] Fax number [phone]

## 2018-06-22 ENCOUNTER — Other Ambulatory Visit: Payer: Self-pay | Admitting: *Deleted

## 2018-06-22 NOTE — Patient Outreach (Signed)
[place] [facility]) Care Management  06/22/2018  [name] 1950-11-13 [id number]   Case closure   Call attempts made on 06/08/18, 06/13/18 and 06/15/18 Unsuccessful outreach letter sent on 06/08/18 without a response   Plan [facility] RN CM will close case after no response from patient within 10 business days. Unable to reach   [name]. Lavina Hamman, RN, BSN, [place] Coordinator Office number [phone] Mobile number [phone]  Main [facility] number [phone] Fax number [phone]

## 2018-07-14 DIAGNOSIS — G4733 Obstructive sleep apnea (adult) (pediatric): Secondary | ICD-10-CM | POA: Diagnosis not present

## 2018-07-26 DIAGNOSIS — J209 Acute bronchitis, unspecified: Secondary | ICD-10-CM | POA: Diagnosis not present

## 2018-07-26 DIAGNOSIS — I10 Essential (primary) hypertension: Secondary | ICD-10-CM | POA: Diagnosis not present

## 2018-07-26 DIAGNOSIS — E1165 Type 2 diabetes mellitus with hyperglycemia: Secondary | ICD-10-CM | POA: Diagnosis not present

## 2018-07-26 DIAGNOSIS — G4733 Obstructive sleep apnea (adult) (pediatric): Secondary | ICD-10-CM | POA: Diagnosis not present

## 2018-08-08 DIAGNOSIS — I10 Essential (primary) hypertension: Secondary | ICD-10-CM | POA: Diagnosis not present

## 2018-08-08 DIAGNOSIS — G4733 Obstructive sleep apnea (adult) (pediatric): Secondary | ICD-10-CM | POA: Diagnosis not present

## 2018-08-08 DIAGNOSIS — Z6841 Body Mass Index (BMI) 40.0 and over, adult: Secondary | ICD-10-CM | POA: Diagnosis not present

## 2018-08-08 DIAGNOSIS — E119 Type 2 diabetes mellitus without complications: Secondary | ICD-10-CM | POA: Diagnosis not present

## 2018-08-09 DIAGNOSIS — E089 Diabetes mellitus due to underlying condition without complications: Secondary | ICD-10-CM | POA: Diagnosis not present

## 2018-08-09 DIAGNOSIS — H524 Presbyopia: Secondary | ICD-10-CM | POA: Diagnosis not present

## 2018-08-09 DIAGNOSIS — E119 Type 2 diabetes mellitus without complications: Secondary | ICD-10-CM | POA: Diagnosis not present

## 2018-08-09 DIAGNOSIS — H2513 Age-related nuclear cataract, bilateral: Secondary | ICD-10-CM | POA: Diagnosis not present

## 2018-08-13 DIAGNOSIS — G4733 Obstructive sleep apnea (adult) (pediatric): Secondary | ICD-10-CM | POA: Diagnosis not present

## 2018-08-15 DIAGNOSIS — I10 Essential (primary) hypertension: Secondary | ICD-10-CM | POA: Diagnosis not present

## 2018-08-15 DIAGNOSIS — G4733 Obstructive sleep apnea (adult) (pediatric): Secondary | ICD-10-CM | POA: Diagnosis not present

## 2018-08-15 DIAGNOSIS — Z0000 Encounter for general adult medical examination without abnormal findings: Secondary | ICD-10-CM | POA: Diagnosis not present

## 2018-08-15 DIAGNOSIS — E1165 Type 2 diabetes mellitus with hyperglycemia: Secondary | ICD-10-CM | POA: Diagnosis not present

## 2018-08-15 DIAGNOSIS — Z6841 Body Mass Index (BMI) 40.0 and over, adult: Secondary | ICD-10-CM | POA: Diagnosis not present

## 2018-08-15 DIAGNOSIS — E786 Lipoprotein deficiency: Secondary | ICD-10-CM | POA: Diagnosis not present

## 2018-10-13 DIAGNOSIS — G4733 Obstructive sleep apnea (adult) (pediatric): Secondary | ICD-10-CM | POA: Diagnosis not present

## 2021-01-17 ENCOUNTER — Other Ambulatory Visit: Payer: Self-pay | Admitting: Internal Medicine

## 2021-01-17 ENCOUNTER — Other Ambulatory Visit (HOSPITAL_COMMUNITY): Payer: Self-pay | Admitting: Internal Medicine

## 2021-01-17 DIAGNOSIS — R1011 Right upper quadrant pain: Secondary | ICD-10-CM

## 2021-01-17 DIAGNOSIS — R109 Unspecified abdominal pain: Secondary | ICD-10-CM

## 2021-01-28 ENCOUNTER — Ambulatory Visit
Admission: RE | Admit: 2021-01-28 | Discharge: 2021-01-28 | Disposition: A | Discharge status: Home / Self Care | Payer: Medicare Other | Source: Ambulatory Visit | Attending: Internal Medicine | Admitting: Internal Medicine

## 2021-01-28 ENCOUNTER — Other Ambulatory Visit: Payer: Self-pay

## 2021-01-28 DIAGNOSIS — R109 Unspecified abdominal pain: Secondary | ICD-10-CM | POA: Diagnosis present

## 2021-01-28 DIAGNOSIS — R1011 Right upper quadrant pain: Secondary | ICD-10-CM | POA: Insufficient documentation

## 2021-01-28 MED ORDER — IOHEXOL 300 MG/ML  SOLN
100.0000 mL | Freq: Once | INTRAMUSCULAR | Status: AC | PRN
  Administered 2021-01-28: 100 mL via INTRAVENOUS

## 2021-02-12 ENCOUNTER — Ambulatory Visit: Payer: Medicare Other | Admitting: Urology

## 2021-02-12 ENCOUNTER — Encounter: Payer: Self-pay | Admitting: Urology

## 2021-02-12 ENCOUNTER — Other Ambulatory Visit: Payer: Self-pay

## 2021-02-12 VITALS — BP 173/82 | HR 80 | Ht 68.0 in | Wt 260.0 lb

## 2021-02-12 DIAGNOSIS — R52 Pain, unspecified: Secondary | ICD-10-CM | POA: Diagnosis not present

## 2021-02-12 DIAGNOSIS — N200 Calculus of kidney: Secondary | ICD-10-CM
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
## 2021-02-13 LAB — URINALYSIS, COMPLETE
Bilirubin, UA: NEGATIVE
Glucose, UA: NEGATIVE
Leukocytes,UA: NEGATIVE
Nitrite, UA: NEGATIVE
Protein,UA: NEGATIVE
RBC, UA: NEGATIVE
Specific Gravity, UA: 1.025 (ref 1.005–1.030)
Urobilinogen, Ur: 0.2 mg/dL (ref 0.2–1.0)
pH, UA: 5.5 (ref 5.0–7.5)

## 2021-02-13 LAB — MICROSCOPIC EXAMINATION
Bacteria, UA: NONE SEEN
Epithelial Cells (non renal): NONE SEEN /hpf (ref 0–10)

## 2022-02-12 ENCOUNTER — Ambulatory Visit: Payer: Medicare Other | Admitting: Urology

## 2022-02-12 ENCOUNTER — Ambulatory Visit: Payer: Self-pay | Admitting: Urology

## 2022-10-23 IMAGING — CT CT ABD-PELV W/ CM
2 of 5 series · 16 of 46 positions shown, 18 images · IV contrast (omnipaque)
Comparison: 06/28/2008

CLINICAL DATA: Right flank pain for the past 2-3 months.

EXAM:
CT ABDOMEN AND PELVIS WITH CONTRAST
TECHNIQUE: Multidetector CT imaging of the abdomen and pelvis was performed
using the standard protocol following bolus administration of
intravenous contrast.
CONTRAST:  100mL OMNIPAQUE IOHEXOL 300 MG/ML  SOLN

[Series 2: abd pelvis 5.00 · axial · 0.97mm/px · z∈[-1595,-1135]mm · 13 of 104 slices shown, 15 images]
[im 6/104  soft-tissue]
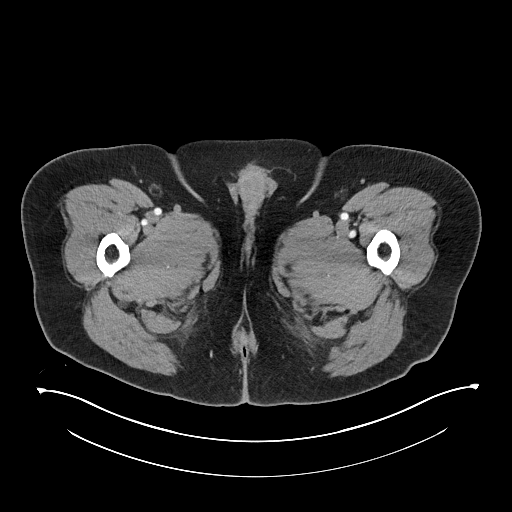
[im 6/104  bone]
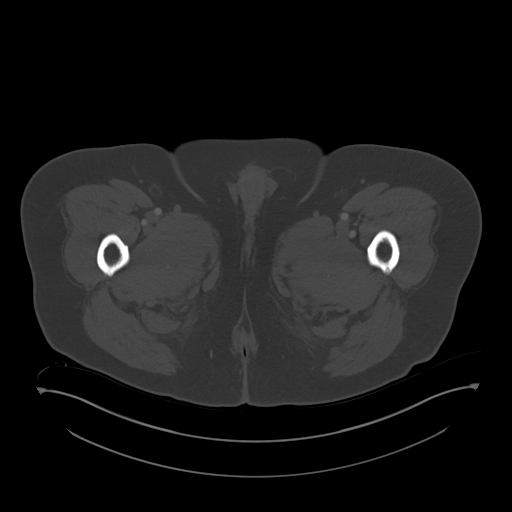
[im 17/104  soft-tissue]
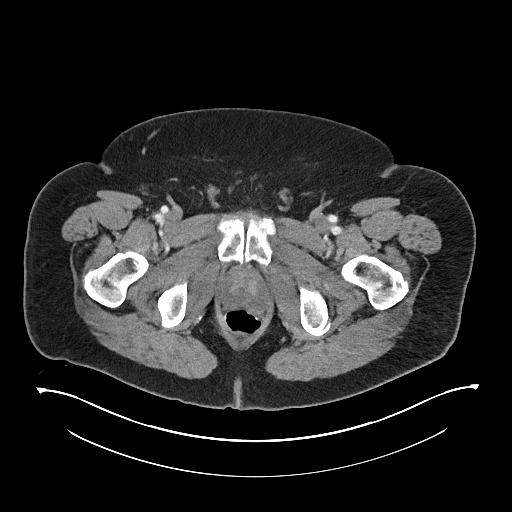
[im 22/104  soft-tissue]
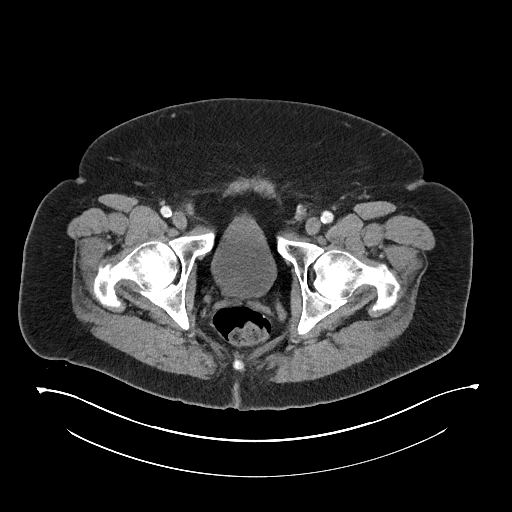
[im 28/104  soft-tissue]
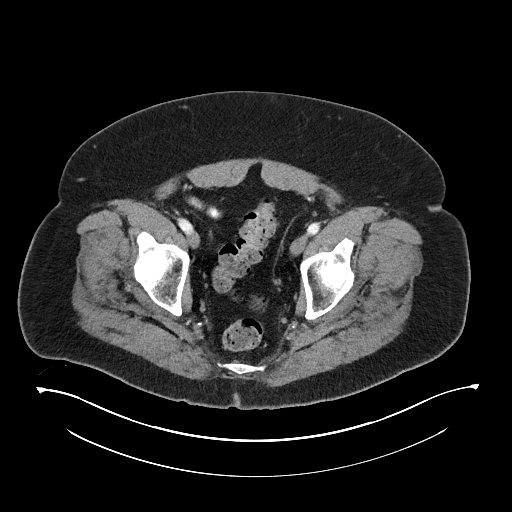
[im 38/104  soft-tissue]
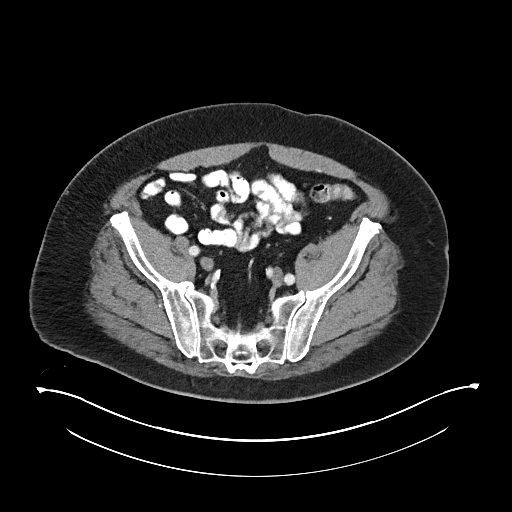
[im 44/104  soft-tissue]
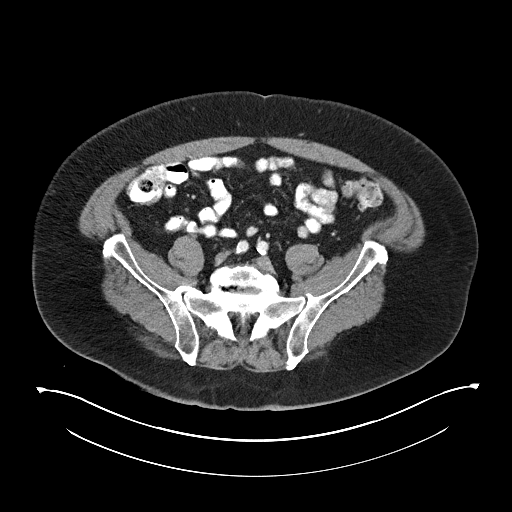
[im 55/104  soft-tissue]
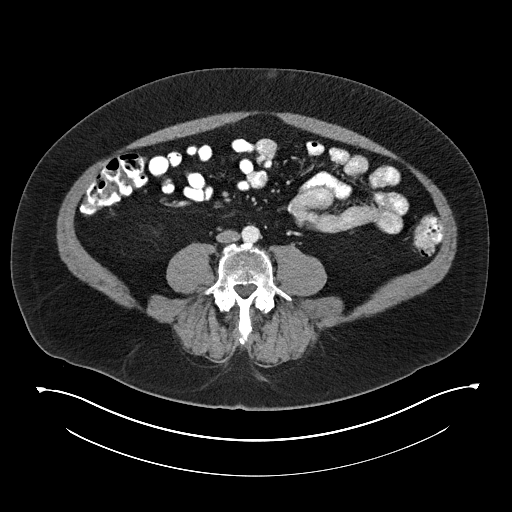
[im 60/104  soft-tissue]
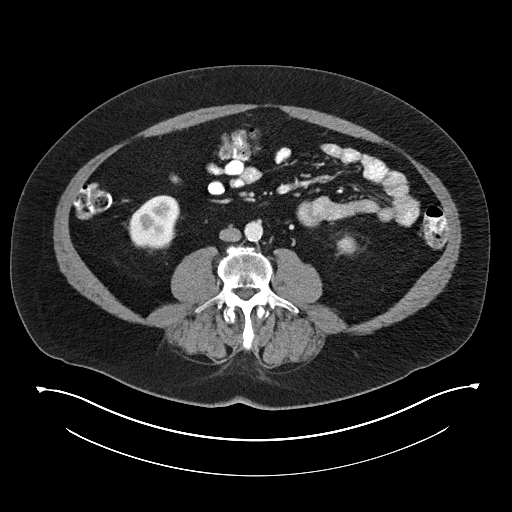
[im 66/104  soft-tissue]
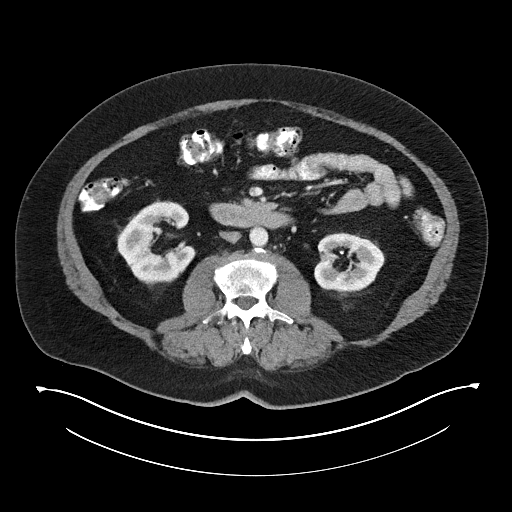
[im 66/104  bone]
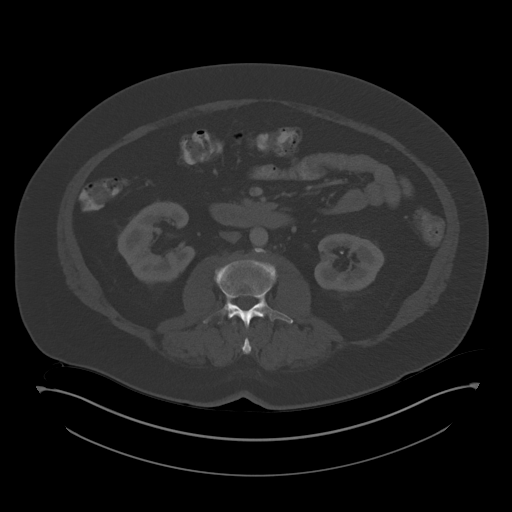
[im 76/104  soft-tissue]
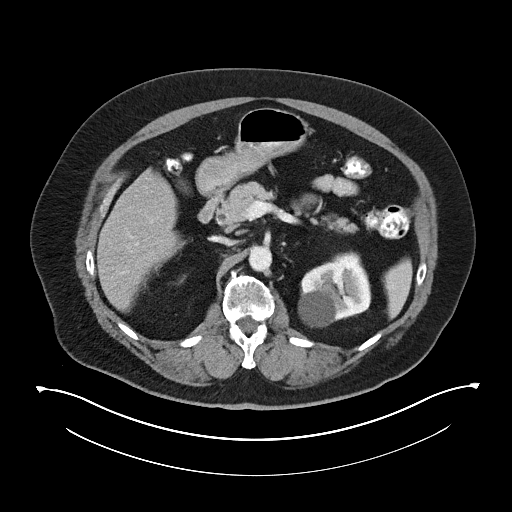
[im 82/104  soft-tissue]
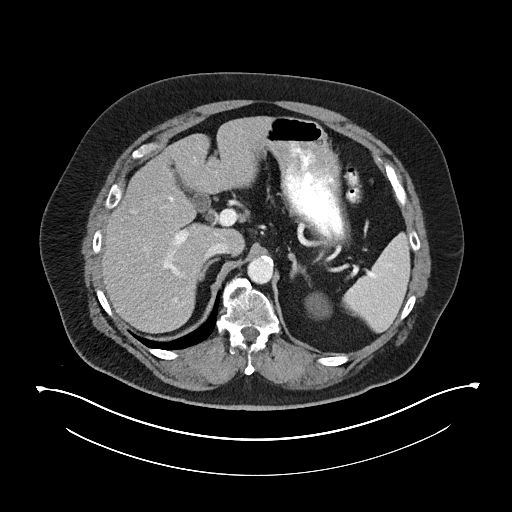
[im 87/104  soft-tissue]
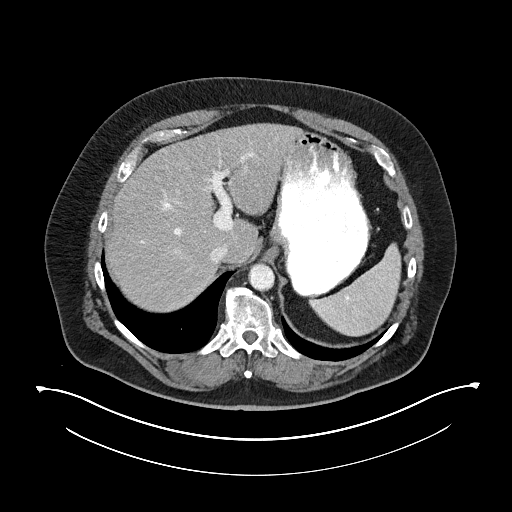
[im 98/104  soft-tissue]
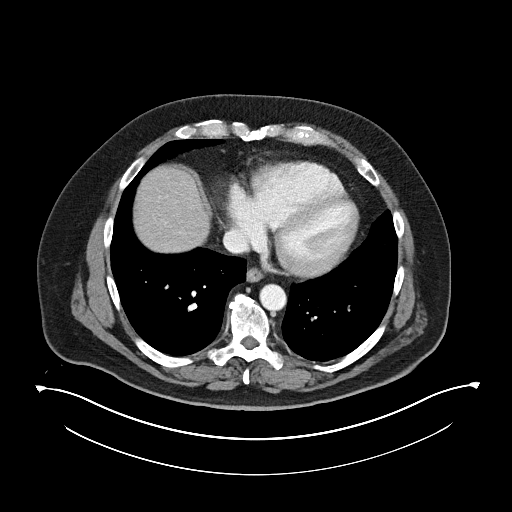

[Series 4: coronals abd pelvis 2.00 cor · coronal · 0.92mm/px · 3 of 171 slices shown]
[im 57/171  soft-tissue]
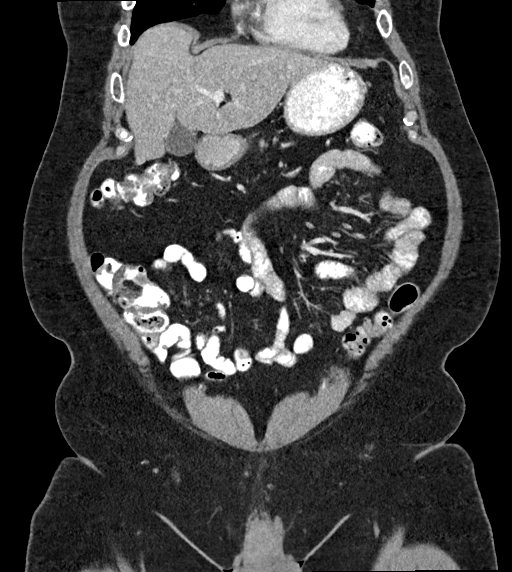
[im 76/171  soft-tissue]
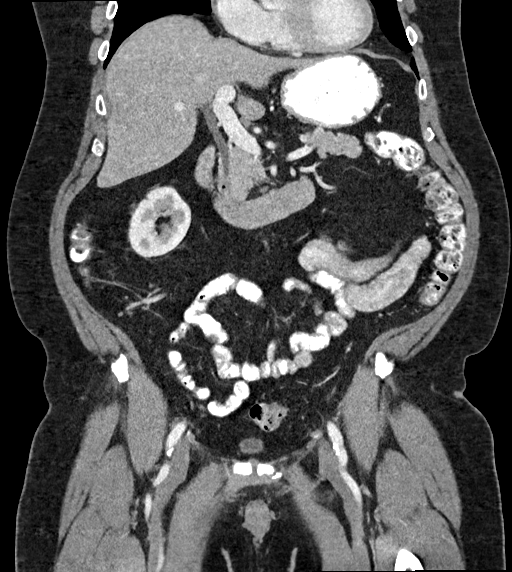
[im 95/171  soft-tissue]
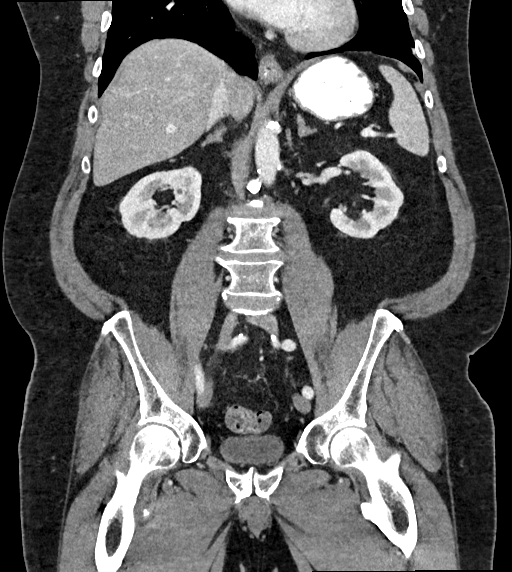

[16 of 46 positions shown; findings below may reference images not displayed]

FINDINGS: Lower chest: The lung bases are clear. The heart size is normal.

Hepatobiliary: Again noted are findings suggestive of hepatic
steatosis. There are small hypoattenuating lesions in the left
hepatic lobe, stable since 0440. These are favored to represent
small cysts. Normal gallbladder.There is no biliary ductal dilation.

Pancreas: Normal contours without ductal dilatation. No
peripancreatic fluid collection.

Spleen: Unremarkable.

Adrenals/Urinary Tract:

--Adrenal glands: Unremarkable.

--Right kidney/ureter: No hydronephrosis or radiopaque kidney
stones.

--Left kidney/ureter: There is a nonobstructing stone in the lower
pole of the left kidney.

--Urinary bladder: Unremarkable.

Stomach/Bowel:

--Stomach/Duodenum: No hiatal hernia or other gastric abnormality.
Normal duodenal course and caliber.

--Small bowel: Unremarkable.

--Colon: Unremarkable.

--Appendix: Normal.

Vascular/Lymphatic: Atherosclerotic calcification is present within
the non-aneurysmal abdominal aorta, without hemodynamically
significant stenosis.

--No retroperitoneal lymphadenopathy.

--No mesenteric lymphadenopathy.

--No pelvic or inguinal lymphadenopathy.

Reproductive: Unremarkable

Other: No ascites or free air. There is a small fat containing
umbilical hernia.

Musculoskeletal. No acute displaced fractures.
IMPRESSION: 1. No acute abdominopelvic abnormality.
2. Nonobstructive left nephrolithiasis.
3. Hepatic steatosis.

Aortic Atherosclerosis (JRGQC-QV8.8).
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
## 2023-11-29 ENCOUNTER — Ambulatory Visit (INDEPENDENT_AMBULATORY_CARE_PROVIDER_SITE_OTHER): Payer: Medicare Other | Admitting: Internal Medicine

## 2023-11-29 VITALS — BP 140/91 | HR 71 | Resp 16 | Ht 68.0 in | Wt 275.0 lb

## 2023-11-29 DIAGNOSIS — G4733 Obstructive sleep apnea (adult) (pediatric): Secondary | ICD-10-CM

## 2023-11-29 DIAGNOSIS — I10 Essential (primary) hypertension: Secondary | ICD-10-CM

## 2023-11-29 DIAGNOSIS — Z7189 Other specified counseling: Secondary | ICD-10-CM

## 2023-11-29 NOTE — Patient Instructions (Signed)

## 2024-03-02 ENCOUNTER — Other Ambulatory Visit: Payer: Self-pay | Admitting: Cardiology

## 2024-03-02 DIAGNOSIS — R5383 Other fatigue: Secondary | ICD-10-CM

## 2024-03-02 DIAGNOSIS — R0609 Other forms of dyspnea: Secondary | ICD-10-CM

## 2024-03-02 DIAGNOSIS — R0602 Shortness of breath: Secondary | ICD-10-CM

## 2024-03-02 DIAGNOSIS — I10 Essential (primary) hypertension: Secondary | ICD-10-CM

## 2024-03-02 DIAGNOSIS — E7800 Pure hypercholesterolemia, unspecified: Secondary | ICD-10-CM

## 2024-03-02 DIAGNOSIS — I252 Old myocardial infarction: Secondary | ICD-10-CM

## 2024-03-02 DIAGNOSIS — E119 Type 2 diabetes mellitus without complications: Secondary | ICD-10-CM
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
## 2024-03-27 ENCOUNTER — Ambulatory Visit (INDEPENDENT_AMBULATORY_CARE_PROVIDER_SITE_OTHER): Admitting: Internal Medicine

## 2024-03-27 VITALS — BP 127/79 | HR 87 | Resp 16 | Ht 67.0 in | Wt 271.0 lb

## 2024-03-27 DIAGNOSIS — G4733 Obstructive sleep apnea (adult) (pediatric): Secondary | ICD-10-CM

## 2024-03-27 DIAGNOSIS — I10 Essential (primary) hypertension: Secondary | ICD-10-CM

## 2024-03-27 DIAGNOSIS — Z7189 Other specified counseling: Secondary | ICD-10-CM

## 2024-03-27 NOTE — Patient Instructions (Signed)

## 2024-04-18 ENCOUNTER — Other Ambulatory Visit: Payer: Self-pay | Admitting: Cardiology

## 2024-04-18 DIAGNOSIS — R0609 Other forms of dyspnea: Secondary | ICD-10-CM

## 2024-04-18 NOTE — Progress Notes (Signed)
 Orders only for Cardiac PET stress test.   Hamp Levine, PA-C

## 2024-04-20 ENCOUNTER — Ambulatory Visit

## 2024-04-20 ENCOUNTER — Encounter: Admission: RE | Admit: 2024-04-20 | Source: Ambulatory Visit

## 2024-05-19 ENCOUNTER — Encounter (HOSPITAL_COMMUNITY): Payer: Self-pay

## 2024-05-19 ENCOUNTER — Other Ambulatory Visit: Payer: Self-pay | Admitting: Cardiology

## 2024-05-19 DIAGNOSIS — R0609 Other forms of dyspnea: Secondary | ICD-10-CM

## 2024-05-19 NOTE — Progress Notes (Signed)
 Orders only for Cardiac PET stress test.   Dorene Comfort, PA-C

## 2024-05-25 ENCOUNTER — Ambulatory Visit
Admission: RE | Admit: 2024-05-25 | Discharge: 2024-05-25 | Disposition: A | Discharge status: Home / Self Care | Source: Ambulatory Visit | Attending: Cardiology | Admitting: Cardiology

## 2024-05-25 DIAGNOSIS — R5383 Other fatigue: Secondary | ICD-10-CM | POA: Insufficient documentation

## 2024-05-25 DIAGNOSIS — E119 Type 2 diabetes mellitus without complications: Secondary | ICD-10-CM | POA: Diagnosis not present

## 2024-05-25 DIAGNOSIS — M47814 Spondylosis without myelopathy or radiculopathy, thoracic region: Secondary | ICD-10-CM | POA: Insufficient documentation

## 2024-05-25 DIAGNOSIS — I10 Essential (primary) hypertension: Secondary | ICD-10-CM | POA: Insufficient documentation

## 2024-05-25 DIAGNOSIS — I252 Old myocardial infarction: Secondary | ICD-10-CM | POA: Insufficient documentation

## 2024-05-25 DIAGNOSIS — E7800 Pure hypercholesterolemia, unspecified: Secondary | ICD-10-CM | POA: Diagnosis not present

## 2024-05-25 DIAGNOSIS — R0602 Shortness of breath: Secondary | ICD-10-CM | POA: Insufficient documentation

## 2024-05-25 DIAGNOSIS — Z6841 Body Mass Index (BMI) 40.0 and over, adult: Secondary | ICD-10-CM | POA: Insufficient documentation

## 2024-05-25 DIAGNOSIS — I700 Atherosclerosis of aorta: Secondary | ICD-10-CM | POA: Insufficient documentation

## 2024-05-25 DIAGNOSIS — R0609 Other forms of dyspnea: Secondary | ICD-10-CM | POA: Diagnosis not present

## 2024-05-25 MED ORDER — REGADENOSON 0.4 MG/5ML IV SOLN
INTRAVENOUS | Status: AC
  Filled 2024-05-25: qty 5

## 2024-05-25 MED ORDER — RUBIDIUM RB82 GENERATOR (RUBYFILL)
25.0000 | PACK | Freq: Once | INTRAVENOUS | Status: AC
  Administered 2024-05-25: 24.94 via INTRAVENOUS

## 2024-05-25 MED ORDER — RUBIDIUM RB82 GENERATOR (RUBYFILL)
25.0000 | PACK | Freq: Once | INTRAVENOUS | Status: AC
  Administered 2024-05-25: 24.92 via INTRAVENOUS

## 2024-05-25 MED ORDER — REGADENOSON 0.4 MG/5ML IV SOLN
0.4000 mg | Freq: Once | INTRAVENOUS | Status: AC
  Administered 2024-05-25: 0.4 mg via INTRAVENOUS
  Filled 2024-05-25: qty 5

## 2024-05-26 LAB — NM PET CT CARDIAC PERFUSION MULTI W/ABSOLUTE BLOODFLOW
MBFR: 2.13
Nuc Rest EF: 54 %
Nuc Stress EF: 62 %
Peak HR: 90 {beats}/min
Rest HR: 76 {beats}/min
Rest MBF: 0.8 ml/g/min
Rest Nuclear Isotope Dose: 24.9 mCi
SRS: 0
SSS: 1
ST Depression (mm): 0 mm
Stress MBF: 1.7 ml/g/min
Stress Nuclear Isotope Dose: 24.9 mCi
TID: 0.96
